# Patient Record
Sex: Female | Born: 1975
Health system: Southern US, Community
[De-identification: ages and names within clinical notes are randomized; demographics above are authoritative.]

## PROBLEM LIST (undated history)

## (undated) DIAGNOSIS — R87619 Unspecified abnormal cytological findings in specimens from cervix uteri: Secondary | ICD-10-CM

## (undated) DIAGNOSIS — E282 Polycystic ovarian syndrome: Secondary | ICD-10-CM

## (undated) DIAGNOSIS — R7303 Prediabetes: Secondary | ICD-10-CM

## (undated) DIAGNOSIS — R946 Abnormal results of thyroid function studies: Secondary | ICD-10-CM

## (undated) DIAGNOSIS — B009 Herpesviral infection, unspecified: Secondary | ICD-10-CM

## (undated) DIAGNOSIS — IMO0002 Reserved for concepts with insufficient information to code with codable children: Secondary | ICD-10-CM

## (undated) DIAGNOSIS — M1990 Unspecified osteoarthritis, unspecified site: Secondary | ICD-10-CM

## (undated) DIAGNOSIS — K219 Gastro-esophageal reflux disease without esophagitis: Secondary | ICD-10-CM

## (undated) DIAGNOSIS — T7840XA Allergy, unspecified, initial encounter: Secondary | ICD-10-CM

## (undated) DIAGNOSIS — E119 Type 2 diabetes mellitus without complications: Secondary | ICD-10-CM

## (undated) DIAGNOSIS — L680 Hirsutism: Secondary | ICD-10-CM

## (undated) HISTORY — DX: Unspecified osteoarthritis, unspecified site: M19.90

## (undated) HISTORY — DX: Prediabetes: R73.03

## (undated) HISTORY — DX: Allergy, unspecified, initial encounter: T78.40XA

## (undated) HISTORY — DX: Reserved for concepts with insufficient information to code with codable children: IMO0002

## (undated) HISTORY — DX: Herpesviral infection, unspecified: B00.9

## (undated) HISTORY — DX: Abnormal results of thyroid function studies: R94.6

## (undated) HISTORY — DX: Polycystic ovarian syndrome: E28.2

## (undated) HISTORY — DX: Gastro-esophageal reflux disease without esophagitis: K21.9

## (undated) HISTORY — DX: Hirsutism: L68.0

## (undated) HISTORY — DX: Unspecified abnormal cytological findings in specimens from cervix uteri: R87.619

---

## 2007-07-05 ENCOUNTER — Ambulatory Visit: Payer: Self-pay | Admitting: Physician Assistant

## 2007-07-05 ENCOUNTER — Encounter: Payer: Self-pay | Admitting: Physician Assistant

## 2007-07-05 ENCOUNTER — Other Ambulatory Visit: Admission: RE | Admit: 2007-07-05 | Discharge: 2007-07-05 | Payer: Self-pay | Admitting: Gynecology

## 2010-09-15 ENCOUNTER — Encounter: Payer: Self-pay | Admitting: Obstetrics & Gynecology

## 2010-09-15 LAB — CONVERTED CEMR LAB
17-OH-Progesterone, LC/MS/MS: <8
Glucose, 2 hour: 108 mg/dL (ref 70–139)
Glucose, Fasting: 82 mg/dL (ref 70–99)
Insulin fasting, serum: 25 microintl units/mL (ref 3–28)

## 2010-09-20 ENCOUNTER — Encounter: Payer: Self-pay | Admitting: Obstetrics & Gynecology

## 2010-09-20 LAB — CONVERTED CEMR LAB
ALT: 9 units/L (ref 0–35)
AST: 11 units/L (ref 0–37)
Alkaline Phosphatase: 46 units/L (ref 39–117)
Creatinine, Ser: 0.91 mg/dL (ref 0.40–1.20)
Total Bilirubin: 0.3 mg/dL (ref 0.3–1.2)

## 2010-10-25 NOTE — Assessment & Plan Note (Signed)
NAME:  Cheryl Welch, WIERS NO.:  [id number]  MEDICAL RECORD NO.:  [id number]           PATIENT TYPE:  LOCATION:  [facility] [place]           FACILITY:  PHYSICIAN:  Elsie Lincoln, MD           DATE OF BIRTH:  DATE OF SERVICE:  10/18/2010                                 CLINIC NOTE  The patient is a 35 year old, G2 P2, female on [place] who is complaining of thinning hair on the head but also mostly female pattern hair growth on her face and abdomen.  The patient has never menstruated well without being on birth control pills.  She had problems getting pregnant, but she did get pregnant twice and has two living children, one was by C- section.  Her periods are still heavy on Beyaz, the birth control pills. She is not having any bleeding between periods.  She has had laser hair removal for some of her hair growth.  PAST MEDICAL HISTORY:  Denies all medical problems.  PAST SURGICAL HISTORY:  C-section in 1994, did not receive a blood transfusion.  GYNECOLOGIC HISTORY:  She had abnormal Pap smear that required a colpo in 2003.  Last Pap smear was in 2008.  She has no history of endometriosis, fibroids, ovarian cyst, no history of sexually transmitted diseases and again, she is on Beyaz and has regular periods that last 5 days, but they are heavy.  MEDICATIONS:  None.  ALLERGIES:  None.  SOCIAL HISTORY:  She is employed by [employer] as a Diplomatic Services operational officer.  She does smoke.  She drinks occasional alcohol.  She does not do drugs.  She has never been sexually or physically abused.  She has had one sexual partner in the last year.  FAMILY HISTORY:  Positive for diabetes, high blood pressure in her parents, and kidney cancer on her father's side.  Review is negative today.  LABORATORY TESTS:  Her prolactin is 11 which is negative.  Total testosterone is 20 which is negative.  The patient had a fasting glucose at 82 with a fasting insulin of 25, and one-hour glucose of 141  with one- hour insulin of 313 and two-hour glucose of 108 with a two-hour glucose of 205.  The patient shows evidence of insulin resistance.  DHEA-sulfate 57.  A 17-hydroxy progesterone less than 8.  CMP shows a normal creatinine of 0.91 and all electrolytes are normal.  ASSESSMENT AND PLAN:  This is a 35 year old female with mild hirsutism, probably anovulatory if not on the birth control pills, most likely polycystic ovary. 1. Continue birth control pills to keep the endometrium thin and to     keep her from getting endometrial hyperplasia. 2. We will add metformin for insulin resistance, and hopefully, this     will help decrease some of her hair growth. 3. We will continue spironolactone if hair growth does not improve. 4. Continue laser hair removal cosmetically necessary.          ______________________________ Elsie Lincoln, MD    KL/MEDQ  D:  10/18/2010  T:  10/19/2010  Job:  [id number]

## 2010-12-06 NOTE — Assessment & Plan Note (Signed)
NAME:  Cheryl Welch, Cheryl Welch NO.:  [id number]   MEDICAL RECORD NO.:  [id number]          PATIENT TYPE:  POB   LOCATION:  [facility] [place]         FACILITY:  [facility]   PHYSICIAN:  Maylon Cos, CNM    DATE OF BIRTH:  Aug 12, 1975   DATE OF SERVICE:  07/05/2007                                  CLINIC NOTE   Cheryl Welch presents today as a 35 year old, African-American female, for a  GYN exam.  Cheryl Welch recently moved from [place] and has a Cheryl care  Cheryl Welch, who she saw in October for a physical exam; however, she was  on her menstrual cycle at the time of that exam and her breast exam and  a Pap/pelvic were deferred.  She presents today for breast exam and a  Pap/pelvic only since she has a Cheryl Welch, who has performed her  annual physical exam previous this year.  She has no complaints in this  regard, and her physical exam is pointed.   PHYSICAL EXAMINATION:  GENERAL:  She is a well-dressed, African-American  female, who appears to be her stated age, in no apparent distress, and  is alert and oriented x3.  HEENT EXAM:  Grossly normal, and cranial nerves are grossly intact.  She  has good dentition.  BREAST EXAM:  Her breasts are symmetrical.  She has no masses.  They are  nontender.  No skin discoloration or changes.  Her nipples are erect  without discharge.  No lymphadenopathy.  PELVIC EXAM:  External genitalia:  She is a Tanner 5 with no lesions on  the external genitalia.  Mucous membranes are pink, and she has good  tone and a regular rugae.  Cervical exam:  Her cervix is completely  anterior on speculum exam but smooth without lesion.  It is non-friable  and she has no cervical motion tenderness on bimanual exam.  Her uterus  is midline, nontender, and not enlarged.  Adnexa also nontender and not  enlarged.   PROBLEM LIST:  She is a 35 year old, African-American female with a  normal female exam.  Contraceptive-wise, she desires no future fertility  and  desires refills on her Cheryl Welch prescription, which were given x1  year.  The patient is encouraged to do monthly self-breast exams and  return in one year for a Pap smear with [place] or with her Cheryl care  Cheryl Welch.           ______________________________  Maylon Cos, CNM     SS/MEDQ  D:  07/05/2007  T:  07/05/2007  Job:  [id number]

## 2011-05-04 ENCOUNTER — Encounter: Payer: Self-pay | Admitting: *Deleted

## 2011-05-05 ENCOUNTER — Ambulatory Visit (INDEPENDENT_AMBULATORY_CARE_PROVIDER_SITE_OTHER): Payer: Managed Care, Other (non HMO) | Admitting: Family

## 2011-05-05 ENCOUNTER — Encounter: Payer: Self-pay | Admitting: Family

## 2011-05-05 DIAGNOSIS — Z1272 Encounter for screening for malignant neoplasm of vagina: Secondary | ICD-10-CM

## 2011-05-05 DIAGNOSIS — E282 Polycystic ovarian syndrome: Secondary | ICD-10-CM | POA: Insufficient documentation

## 2011-05-05 DIAGNOSIS — N912 Amenorrhea, unspecified: Secondary | ICD-10-CM

## 2011-05-05 DIAGNOSIS — Z01419 Encounter for gynecological examination (general) (routine) without abnormal findings: Secondary | ICD-10-CM

## 2011-05-05 DIAGNOSIS — Z113 Encounter for screening for infections with a predominantly sexual mode of transmission: Secondary | ICD-10-CM

## 2011-05-05 LAB — WET PREP FOR TRICH, YEAST, CLUE

## 2011-05-05 NOTE — Patient Instructions (Signed)
Primary Amenorrhea   No Periods, Absent Periods, Absent Menses   Primary amenorrhea is the absence of any menstrual flow in a woman by the age of 12. An average age for the start of menstruation is age 35. Primary amenorrhea is not considered to have occurred until a girl is over age 29 and has never menstruated. This may occur with or without other signs of puberty. CAUSES Some common causes of not menstruating include:  Malnutrition.   Low blood sugar (hypoglycemia).     Polycystic ovarian disease (cysts in the ovaries, not ovulating).     Absence of the vagina, uterus, or ovaries since birth (congenital).     Extreme obesity.   Cystic fibrosis.     Drastic weight loss from any cause.     Over-exercising (running, biking) causing loss of body fat.     Pituitary gland tumor in the brain.     Long-term (chronic) illnesses.   Cushing's disease.     Thyroid disease (hypothyroidism, hyperthyroidism).     Part of the brain (hypothalamus) not functioning.     Premature ovarian failure.     DIAGNOSIS This diagnosis is made by doing a medical history and physical exam. Often, numerous blood tests of different hormones in the body may be done, along with some urine testing. Specialized x-rays may need to be done, as well as measuring the Body Mass Index (BMI). If your BMI is less than 20, the hypothalamus may be the problem. If it is greater than 30, the cause may be diabetes mellitus. Pregnancy must be ruled out. TREATMENT Treatment depends on the cause of the amenorrhea. If an eating disorder is present, this can be treated with an adequate diet and therapy. Chronic illnesses may improve with treatment of the illness. Overall, the outlook is good, and amenorrhea may be corrected with medicines, lifestyle changes, or surgery. If the amenorrhea can not be corrected, it is sometimes possible to create a false menstruation with medicines, to help young women feel more normal. Should  emotional problems occur, your caregiver will be able to help you.   Document Released: 07/10/2005 Document Re-Released: 08/01/2009 [facility] Patient Information 2011 [place], [place].

## 2011-05-05 NOTE — Progress Notes (Signed)
  Subjective:     Cheryl Welch is a 35 y.o. female and is here for a comprehensive physical exam. The patient reports on oral contraceptive with LMP in 2009 due to not taking last week of ocs to avoid heavy periods, cramping, and clots.  Last pap in 2008.  History of abnormal pap - LSIL with normal colpo.  Pap in 2008 negative.   Reports irritation (raw) area in vagina x one day.  Also reports increased bloating x past year.  Denies pelvic pain, change of appetite or bowel pattern.  History   Social History  . Marital Status: Married    Spouse Name: N/A    Number of Children: N/A  . Years of Education: N/A   Occupational History  . secretary [employer]   Social History Main Topics  . Smoking status: Never Smoker   . Smokeless tobacco: Never Used  . Alcohol Use: Yes     occassional  . Drug Use: No  . Sexually Active: Yes -- Female partner(s)    Birth Control/ Protection: Pill   Other Topics Concern  . Not on file   Social History Narrative  . No narrative on file   Health Maintenance  Topic Date Due  . Pap Smear  04/05/1994  . Tetanus/tdap  04/06/1995  . Influenza Vaccine  04/24/2011    Review of Systems Pertinent items are noted in HPI.   Objective:    BP 118/72  Pulse 80  Temp(Src) 98.6 F (37 C) (Oral)  Resp 97  Ht 5\' 7"  (1.702 m)  Wt 173 lb (78.472 kg)  BMI 27.10 kg/m2 General appearance: alert, cooperative and appears stated age Head: Normocephalic, without obvious abnormality, atraumatic Throat: normal findings: buccal mucosa normal Neck: no adenopathy, no JVD, supple, symmetrical, trachea midline and thyroid not enlarged, symmetric, no tenderness/mass/nodules Lungs: clear to auscultation bilaterally Breasts: normal appearance, no masses or tenderness, No nipple retraction or dimpling, No nipple discharge or bleeding, No axillary or supraclavicular adenopathy, Normal to palpation without dominant masses, Taught monthly breast self examination Heart: regular  rate and rhythm, S1, S2 normal, no murmur, click, rub or gallop Abdomen: soft, non-tender; bowel sounds normal; no masses,  no organomegaly Pelvic: cervix normal in appearance, external genitalia normal, no adnexal masses or tenderness, no cervical motion tenderness, rectovaginal septum normal, uterus normal size, shape, and consistency and vagina normal without discharge Skin: Skin color, texture, turgor normal. No rashes or lesions    Assessment:    Healthy female exam.  Amenorrhea     Plan:    Pelvic US Discussed importance of having break thru bleed every four months.  GC/CT, wet prep, and pap to lab  FU in 2 weeks [facility]

## 2011-05-08 ENCOUNTER — Other Ambulatory Visit: Payer: Self-pay | Admitting: Family

## 2011-05-08 DIAGNOSIS — N912 Amenorrhea, unspecified: Secondary | ICD-10-CM

## 2011-05-09 ENCOUNTER — Ambulatory Visit
Admission: RE | Admit: 2011-05-09 | Discharge: 2011-05-09 | Disposition: A | Discharge status: Home / Self Care | Payer: Managed Care, Other (non HMO) | Source: Ambulatory Visit | Attending: Obstetrics and Gynecology | Admitting: Obstetrics and Gynecology

## 2011-05-09 DIAGNOSIS — N912 Amenorrhea, unspecified: Secondary | ICD-10-CM

## 2011-08-01 ENCOUNTER — Encounter: Payer: Self-pay | Admitting: Obstetrics & Gynecology

## 2011-08-01 ENCOUNTER — Other Ambulatory Visit: Payer: Self-pay | Admitting: Obstetrics & Gynecology

## 2011-08-01 MED ORDER — AMOXICILLIN-POT CLAVULANATE 875-125 MG PO TABS: 1.0000 | ORAL_TABLET | Freq: Two times a day (BID) | ORAL | Status: AC

## 2011-08-09 ENCOUNTER — Other Ambulatory Visit: Payer: Self-pay | Admitting: Obstetrics & Gynecology

## 2011-08-09 DIAGNOSIS — N83209 Unspecified ovarian cyst, unspecified side: Secondary | ICD-10-CM

## 2011-08-10 ENCOUNTER — Ambulatory Visit
Admission: RE | Admit: 2011-08-10 | Discharge: 2011-08-10 | Disposition: A | Discharge status: Home / Self Care | Payer: Managed Care, Other (non HMO) | Source: Ambulatory Visit | Attending: Obstetrics & Gynecology | Admitting: Obstetrics & Gynecology

## 2011-08-10 DIAGNOSIS — N83209 Unspecified ovarian cyst, unspecified side: Secondary | ICD-10-CM

## 2011-08-29 ENCOUNTER — Other Ambulatory Visit: Payer: Self-pay | Admitting: Obstetrics & Gynecology

## 2011-09-01 LAB — URINE CULTURE

## 2012-04-08 ENCOUNTER — Emergency Department
Admission: EM | Admit: 2012-04-08 | Discharge: 2012-04-08 | Disposition: A | Discharge status: Home / Self Care | Payer: Managed Care, Other (non HMO) | Source: Home / Self Care

## 2012-04-08 ENCOUNTER — Encounter: Payer: Self-pay | Admitting: *Deleted

## 2012-04-08 DIAGNOSIS — J329 Chronic sinusitis, unspecified: Secondary | ICD-10-CM

## 2012-04-08 MED ORDER — AMOXICILLIN-POT CLAVULANATE 875-125 MG PO TABS: 1.0000 | ORAL_TABLET | Freq: Two times a day (BID) | ORAL | Status: DC

## 2012-04-08 MED ORDER — METHYLPREDNISOLONE SODIUM SUCC 125 MG IJ SOLR
125.0000 mg | Freq: Once | INTRAMUSCULAR | Status: AC
  Administered 2012-04-08: 125 mg via INTRAMUSCULAR

## 2012-04-08 NOTE — ED Notes (Signed)
Patient c/o sinus pain, mostly on left side, HA, and eye pain x 5 days. Taken sudafed, Mucinex, IBF, and allegra without relief.

## 2012-04-08 NOTE — ED Provider Notes (Signed)
History     CSN: [id number]  Arrival date & time 04/08/12  1418   First MD Initiated Contact with Patient 04/08/12 1438      Chief Complaint  Patient presents with  . Facial Pain   HPI SINUSITIS Onset:  5-7 days   Location: L frontal/maxillary sinus Description:sinus pressure, nasal congestion, mild cough, severe L sided sinus pain. No LOV.   Modifying factors: none  Symptoms Cough:  mild Discharge:  Post nasal drip Fever: np Sinus Pressure:  yes Ears Blocked:  np Teeth Ache:  mild Frontal Headache:  yes Second Sickening:  no  Red Flags Change in mental state: no Change in vision: no    Past Medical History  Diagnosis Date  . PCO (polycystic ovaries)   . Hirsutism   . Abnormal Pap smear     2004; LGSIL??>colpo>nml    Past Surgical History  Procedure Date  . Cesarean section 1994    Family History  Problem Relation Age of Onset  . Cancer Father     kidney  . Hypertension Father   . Hypertension Mother   . Diabetes Maternal Grandmother     History  Substance Use Topics  . Smoking status: Never Smoker   . Smokeless tobacco: Never Used  . Alcohol Use: Yes     occassional    OB History    Grav Para Term Preterm Abortions TAB SAB Ect Mult Living   2 2 2       2       Review of Systems  All other systems reviewed and are negative.    Allergies  Review of patient's allergies indicates no known allergies.  Home Medications   Current Outpatient Rx  Name Route Sig Dispense Refill  . DROSPIREN-ETH ESTRAD-LEVOMEFOL 3-0.02-0.451 MG PO TABS Oral Take 1 tablet by mouth daily.      . LUBIPROSTONE 8 MCG PO CAPS Oral Take 8 mcg by mouth 2 (two) times daily with a meal.      . METFORMIN HCL 500 MG PO TABS Oral Take 500 mg by mouth 2 (two) times daily with a meal.      . SPIRONOLACTONE 50 MG PO TABS Oral Take 50 mg by mouth daily.        BP 138/88  Pulse 99  Temp 99.5 F (37.5 C) (Oral)  Resp 14  Ht 5\' 7"  (1.702 m)  Wt 179 lb (81.194 kg)  BMI  28.04 kg/m2  SpO2 100%  Physical Exam  Constitutional: She appears well-developed and well-nourished.  HENT:  Head: Normocephalic and atraumatic.  Right Ear: External ear normal.  Left Ear: External ear normal.       +mild nasal erythema, + post oropharyngeal erythema  + TTP over L maxillary/frontal sinus    Eyes: Conjunctivae normal are normal. Pupils are equal, round, and reactive to light.  Neck: Normal range of motion. Neck supple.  Cardiovascular: Normal rate and regular rhythm.   Pulmonary/Chest: Effort normal and breath sounds normal.  Abdominal: Soft. Bowel sounds are normal.  Lymphadenopathy:    She has no cervical adenopathy.    ED Course  Procedures (including critical care time)  Labs Reviewed - No data to display No results found.   1. Sinusitis       MDM  Will treat with augmentin x 10 days.  Solumedrol 125mg  IM x 1.  Restart allegra and flonase.  Discussed infectious red flags.  Follow up as needed.      The patient and/or  caregiver has been counseled thoroughly with regard to treatment plan and/or medications prescribed including dosage, schedule, interactions, rationale for use, and possible side effects and they verbalize understanding. Diagnoses and expected course of recovery discussed and will return if not improved as expected or if the condition worsens. Patient and/or caregiver verbalized understanding.             Doree Albee, MD 04/08/12 [phone]

## 2012-08-01 ENCOUNTER — Ambulatory Visit: Payer: Managed Care, Other (non HMO) | Admitting: Obstetrics & Gynecology

## 2012-10-31 ENCOUNTER — Other Ambulatory Visit: Payer: Self-pay | Admitting: *Deleted

## 2012-10-31 ENCOUNTER — Other Ambulatory Visit: Payer: Self-pay | Admitting: Obstetrics & Gynecology

## 2012-10-31 MED ORDER — LUBIPROSTONE 8 MCG PO CAPS: 8.0000 ug | ORAL_CAPSULE | Freq: Two times a day (BID) | ORAL | Status: DC

## 2012-10-31 MED ORDER — LACTULOSE 10 GM/15ML PO SOLN: 20.0000 g | Freq: Two times a day (BID) | ORAL | Status: DC

## 2012-10-31 NOTE — Telephone Encounter (Signed)
error 

## 2013-06-13 ENCOUNTER — Other Ambulatory Visit: Payer: Self-pay | Admitting: Advanced Practice Midwife

## 2013-06-13 DIAGNOSIS — B001 Herpesviral vesicular dermatitis: Secondary | ICD-10-CM

## 2013-06-13 MED ORDER — VALACYCLOVIR HCL 500 MG PO TABS: 2000.0000 mg | ORAL_TABLET | Freq: Two times a day (BID) | ORAL | Status: DC

## 2013-06-13 NOTE — Progress Notes (Signed)
Cold sore

## 2013-06-28 ENCOUNTER — Encounter: Payer: 59 | Attending: Internal Medicine | Admitting: *Deleted

## 2013-06-28 DIAGNOSIS — E669 Obesity, unspecified: Secondary | ICD-10-CM | POA: Insufficient documentation

## 2013-06-28 DIAGNOSIS — Z713 Dietary counseling and surveillance: Secondary | ICD-10-CM | POA: Insufficient documentation

## 2013-06-28 NOTE — Progress Notes (Signed)
Subjective:     Patient ID: Cheryl Welch, female   DOB: [birth year], 37 y.o.   MRN: [id number]  Patient was seen on 06/28/2013 for the Weight Loss Class at the [facility]. The following learning objectives were met by the patient during this class:   Describe healthy choices in each food group  Describe portion size of foods  Use plate method for meal planning  Demonstrate how to read Nutrition Facts food label  Set realistic goals for weight loss, diet changes, and physical activity.      HPI Review of Systems Objective:   Physical Exam Assessment:   Plan:

## 2014-05-25 ENCOUNTER — Encounter: Payer: Self-pay | Admitting: *Deleted

## 2014-08-03 ENCOUNTER — Other Ambulatory Visit (HOSPITAL_BASED_OUTPATIENT_CLINIC_OR_DEPARTMENT_OTHER): Payer: Self-pay | Admitting: Internal Medicine

## 2014-08-03 DIAGNOSIS — M7989 Other specified soft tissue disorders: Secondary | ICD-10-CM

## 2015-07-20 DIAGNOSIS — E038 Other specified hypothyroidism: Secondary | ICD-10-CM | POA: Insufficient documentation

## 2015-07-20 DIAGNOSIS — E039 Hypothyroidism, unspecified: Secondary | ICD-10-CM

## 2015-07-20 DIAGNOSIS — K30 Functional dyspepsia: Secondary | ICD-10-CM | POA: Insufficient documentation

## 2015-07-20 DIAGNOSIS — J309 Allergic rhinitis, unspecified: Secondary | ICD-10-CM | POA: Insufficient documentation

## 2015-07-20 DIAGNOSIS — L659 Nonscarring hair loss, unspecified: Secondary | ICD-10-CM | POA: Insufficient documentation

## 2015-07-20 HISTORY — DX: Hypothyroidism, unspecified: E03.9

## 2015-08-27 DIAGNOSIS — Z0000 Encounter for general adult medical examination without abnormal findings: Secondary | ICD-10-CM | POA: Diagnosis not present

## 2015-08-27 LAB — CBC AND DIFFERENTIAL
HCT: 42 (ref 36–46)
HEMOGLOBIN: 13.2 (ref 12.0–16.0)
Platelets: 274 (ref 150–399)
WBC: 7.1

## 2015-08-27 LAB — HEPATIC FUNCTION PANEL
ALK PHOS: 94 (ref 25–125)
ALT: 8 (ref 7–35)
AST: 10 — AB (ref 13–35)

## 2015-08-27 LAB — BASIC METABOLIC PANEL
BUN: 5 (ref 4–21)
Creatinine: 0.7 (ref <=1.1)
GLUCOSE: 72
POTASSIUM: 4.4 (ref 3.4–5.3)
SODIUM: 137 (ref 137–147)

## 2015-08-27 LAB — LIPID PANEL
Cholesterol: 191 (ref 0–200)
HDL: 70 (ref 35–70)
LDL CALC: 107
Triglycerides: 110 (ref 40–160)

## 2015-08-27 LAB — TSH: TSH: 1.78 (ref <=5.90)

## 2015-09-03 DIAGNOSIS — B009 Herpesviral infection, unspecified: Secondary | ICD-10-CM

## 2015-09-03 DIAGNOSIS — E282 Polycystic ovarian syndrome: Secondary | ICD-10-CM | POA: Diagnosis not present

## 2015-09-03 DIAGNOSIS — R319 Hematuria, unspecified: Secondary | ICD-10-CM | POA: Diagnosis not present

## 2015-09-03 DIAGNOSIS — Z0000 Encounter for general adult medical examination without abnormal findings: Secondary | ICD-10-CM | POA: Diagnosis not present

## 2015-09-03 DIAGNOSIS — K644 Residual hemorrhoidal skin tags: Secondary | ICD-10-CM | POA: Insufficient documentation

## 2015-09-03 DIAGNOSIS — J3089 Other allergic rhinitis: Secondary | ICD-10-CM | POA: Diagnosis not present

## 2015-09-03 DIAGNOSIS — L659 Nonscarring hair loss, unspecified: Secondary | ICD-10-CM | POA: Diagnosis not present

## 2015-09-03 DIAGNOSIS — B001 Herpesviral vesicular dermatitis: Secondary | ICD-10-CM | POA: Insufficient documentation

## 2015-09-03 HISTORY — DX: Hematuria, unspecified: R31.9

## 2015-09-03 HISTORY — DX: Herpesviral infection, unspecified: B00.9

## 2015-09-03 MED FILL — FLUTICASONE PROP 50 MCG SPR: 50 | 30 days supply | Qty: 16 | Fill #0

## 2015-09-03 MED FILL — spIRONOLACTONE 100 MG TAB: 100 | 30 days supply | Qty: 60 | Fill #0

## 2015-09-03 MED FILL — SAFYRAL TABLET: 3-0.03-0.45 | 28 days supply | Qty: 28 | Fill #5

## 2015-09-03 MED FILL — VALACYCLOVIR HCL 500 MG TAB: 500 | 30 days supply | Qty: 30 | Fill #0

## 2015-10-14 MED FILL — FLUTICASONE PROP 50 MCG SPR: 50 | 30 days supply | Qty: 16 | Fill #1

## 2015-10-14 MED FILL — SAFYRAL TABLET: 3-0.03-0.45 | 28 days supply | Qty: 28 | Fill #6

## 2015-10-14 MED FILL — SPIRONOLACTONE 100 MG TAB: 100 | 30 days supply | Qty: 60 | Fill #1

## 2015-10-14 MED FILL — VALACYCLOVIR HCL 500 MG TAB: 500 | 30 days supply | Qty: 30 | Fill #1

## 2015-11-15 DIAGNOSIS — R944 Abnormal results of kidney function studies: Secondary | ICD-10-CM | POA: Diagnosis not present

## 2015-12-29 MED FILL — SPIRONOLACTONE 100 MG TAB: 100 | 30 days supply | Qty: 60 | Fill #2

## 2016-01-03 MED FILL — SAFYRAL TABLET: 3-0.03-0.45 | 28 days supply | Qty: 28 | Fill #0

## 2016-03-16 MED FILL — SAFYRAL TABLET: 3-0.03-0.45 | 28 days supply | Qty: 28 | Fill #1

## 2016-03-23 ENCOUNTER — Other Ambulatory Visit: Payer: Self-pay | Admitting: Sports Medicine

## 2016-03-23 DIAGNOSIS — M25561 Pain in right knee: Secondary | ICD-10-CM

## 2016-03-23 DIAGNOSIS — M25562 Pain in left knee: Principal | ICD-10-CM

## 2016-03-24 ENCOUNTER — Ambulatory Visit (INDEPENDENT_AMBULATORY_CARE_PROVIDER_SITE_OTHER): Payer: 59

## 2016-03-24 DIAGNOSIS — M25562 Pain in left knee: Secondary | ICD-10-CM | POA: Diagnosis not present

## 2016-03-24 DIAGNOSIS — M25561 Pain in right knee: Secondary | ICD-10-CM

## 2016-04-21 MED FILL — SAFYRAL TABLET: 3-0.03-0.45 | 28 days supply | Qty: 28 | Fill #2

## 2016-06-02 MED FILL — SAFYRAL TABLET: 3-0.03-0.45 | 28 days supply | Qty: 28 | Fill #3

## 2016-06-02 MED FILL — VALACYCLOVIR HCL 500 MG TAB: 500 | 30 days supply | Qty: 30 | Fill #2

## 2016-07-06 MED FILL — SAFYRAL TABLET: 3-0.03-0.45 | 28 days supply | Qty: 28 | Fill #4

## 2016-07-28 DIAGNOSIS — M2242 Chondromalacia patellae, left knee: Secondary | ICD-10-CM | POA: Diagnosis not present

## 2016-07-28 MED FILL — SAFYRAL TABLET: 3-0.03-0.45 | 28 days supply | Qty: 28 | Fill #5

## 2016-08-04 MED FILL — FLUTICASONE PROP 50 MCG SPR: 50 | 30 days supply | Qty: 16 | Fill #2

## 2016-08-04 MED FILL — VALACYCLOVIR HCL 500 MG TAB: 500 | 90 days supply | Qty: 90 | Fill #3

## 2016-09-06 MED FILL — SAFYRAL TABLET: 3-0.03-0.45 | 28 days supply | Qty: 28 | Fill #6

## 2016-10-03 MED FILL — SAFYRAL TABLET: 3-0.03-0.45 | 28 days supply | Qty: 28 | Fill #7

## 2016-10-25 MED FILL — SAFYRAL TABLET: 3-0.03-0.45 | 28 days supply | Qty: 28 | Fill #8

## 2016-11-08 DIAGNOSIS — E282 Polycystic ovarian syndrome: Secondary | ICD-10-CM | POA: Diagnosis not present

## 2016-11-08 DIAGNOSIS — N926 Irregular menstruation, unspecified: Secondary | ICD-10-CM | POA: Diagnosis not present

## 2016-11-08 DIAGNOSIS — N939 Abnormal uterine and vaginal bleeding, unspecified: Secondary | ICD-10-CM | POA: Diagnosis not present

## 2016-11-08 DIAGNOSIS — R7303 Prediabetes: Secondary | ICD-10-CM | POA: Diagnosis not present

## 2016-11-08 DIAGNOSIS — E039 Hypothyroidism, unspecified: Secondary | ICD-10-CM | POA: Diagnosis not present

## 2016-11-17 MED FILL — INTROVALE 0.15-0.03 MG TAB: 0.15-0.03 | 91 days supply | Qty: 91 | Fill #0

## 2016-12-22 DIAGNOSIS — Z01419 Encounter for gynecological examination (general) (routine) without abnormal findings: Secondary | ICD-10-CM | POA: Diagnosis not present

## 2016-12-22 DIAGNOSIS — Z6841 Body Mass Index (BMI) 40.0 and over, adult: Secondary | ICD-10-CM | POA: Diagnosis not present

## 2016-12-22 DIAGNOSIS — Z1151 Encounter for screening for human papillomavirus (HPV): Secondary | ICD-10-CM | POA: Diagnosis not present

## 2016-12-25 MED FILL — METFORMIN HCL ER 500 MG TAB: 500 | 30 days supply | Qty: 30 | Fill #0

## 2017-01-09 DIAGNOSIS — D259 Leiomyoma of uterus, unspecified: Secondary | ICD-10-CM | POA: Diagnosis not present

## 2017-01-09 DIAGNOSIS — E282 Polycystic ovarian syndrome: Secondary | ICD-10-CM | POA: Diagnosis not present

## 2017-02-06 ENCOUNTER — Encounter: Payer: Self-pay | Admitting: Gastroenterology

## 2017-02-07 MED FILL — SETLAKIN 0.15 MG-0.03 MG TA: 0.15-0.03 | 91 days supply | Qty: 91 | Fill #1

## 2017-02-15 ENCOUNTER — Ambulatory Visit (INDEPENDENT_AMBULATORY_CARE_PROVIDER_SITE_OTHER): Payer: 59 | Admitting: Gastroenterology

## 2017-02-15 ENCOUNTER — Encounter (INDEPENDENT_AMBULATORY_CARE_PROVIDER_SITE_OTHER): Payer: Self-pay

## 2017-02-15 ENCOUNTER — Encounter: Payer: Self-pay | Admitting: Gastroenterology

## 2017-02-15 VITALS — BP 126/74 | HR 94 | Ht 68.0 in | Wt 200.0 lb

## 2017-02-15 DIAGNOSIS — K219 Gastro-esophageal reflux disease without esophagitis: Secondary | ICD-10-CM | POA: Diagnosis not present

## 2017-02-15 DIAGNOSIS — K5900 Constipation, unspecified: Secondary | ICD-10-CM | POA: Diagnosis not present

## 2017-02-15 MED ORDER — PANTOPRAZOLE SODIUM 40 MG PO TBEC: 40.0000 mg | DELAYED_RELEASE_TABLET | Freq: Every day | ORAL | 2 refills | Status: DC

## 2017-02-15 MED FILL — PANTOPRAZOLE SOD DR 40 MG T: 40 | 30 days supply | Qty: 30 | Fill #0

## 2017-02-15 NOTE — Progress Notes (Signed)
Cheryl Welch 41 y.o. 13-May-1976 [id number]  Assessment & Plan:   Encounter Diagnoses  Name Primary?  . Gastroesophageal reflux disease, esophagitis presence not specified Yes  . Constipation, unspecified constipation type    1. Try Rx pantoprazole 40mg  daily to control acid reflux symptoms.   2. Encourage daily usage of Miralax or Benefiber to control constipation and straining.  3. GERD diet given to patient.  Patient education provided about eating smaller meals, limit carbonated beverages, do not lay down directly after eating, and to elevate head of bed to control symptoms.  3. Follow up Aug 30th with Alonza Bogus PA-C   Subjective:   Chief Complaint: 1. "I cough and burp a lot when I eat, or after eating" 2. Constipation  HPI Cheryl Welch is a pleasant 41yo african [place] female who presents the office today for evaluation of chronic reflux like symptoms as well as chronic intermittent constipation.  She has a past medical history significant for poly cystic ovarian syndrome and insulin resistance- non diabetic.    Today she complains of a long history (years) of having increased coughing and needing to clear her throat while eating or shortly after eating.  She describes excess gassiness, belching and mild bloating.  She denies dysphagia or sensation of food sticking.  She sometimes sips on coke while eating to help her burp and relieve some of the pressure and wash the food down.  She also says she tastes her food when she burps and at times has a mild burning sensation in her throat.  These symptoms increase if she lays down and she says at night she feels the burning in her throat. She has tried Nexium but not on a consistent basis and said when she did take it she is not sure it helped.  She denies nausea or vomiting or weight loss.  Her constipation is also chronic in nature and she has dealt with it for years.  She denies abdominal pain, but says she feels  bloated most of the time.  She has 2 bowel movements per week but often strains and has to take a long time to have a bowel movement.  She has tried Miralax and some OTC stool softner but has never been consistent with use.  She has a history of hemorrhoids, but they are asymptomatic at this time.  She does not have any episodes of diarrhea.  No Known Allergies Current Meds  Medication Sig  . metFORMIN (GLUCOPHAGE) 500 MG tablet Take 500 mg by mouth 2 (two) times daily with a meal.    . spironolactone (ALDACTONE) 50 MG tablet Take 100 mg by mouth 2 (two) times daily.   . valACYclovir (VALTREX) 500 MG tablet Take 4 tablets (2,000 mg total) by mouth 2 (two) times daily. 4 tabs now and 4 tabs 12 hrs later. Take 1 tablet daily PRN for suppression.   Past Medical History:  Diagnosis Date  . Abnormal Pap smear    2004; LGSIL??>colpo>nml  . Hirsutism   . PCO (polycystic ovaries)    Past Surgical History:  Procedure Laterality Date  . [place]   Social History   Social History  . Marital status: Married    Spouse name: N/A  . Number of children: N/A  . 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 Smoking status: Never Smoker  . Smokeless tobacco: Never Used  . Alcohol use  Yes     Comment: occassional  . Drug use: No  . Sexual activity: Yes    Partners: Male    Birth control/ protection: Pill   Other Topics Concern  . Not on file   Social History Narrative  . No narrative on file   family history includes Cancer in her father; Colon polyps in her father; Diabetes in her maternal grandmother; Hypertension in her father and mother.   Review of Systems Review of Systems  Constitutional: Negative for chills, fever and weight loss.  HENT: Negative.   Respiratory: Positive for cough. Negative for hemoptysis, sputum production, shortness of breath and wheezing.   Cardiovascular: Negative.  Negative for  chest pain, palpitations and leg swelling.  Gastrointestinal: Positive for abdominal pain, constipation and heartburn. Negative for blood in stool, diarrhea, nausea and vomiting.  Genitourinary: Negative.   Skin: Negative for itching and rash.  Psychiatric/Behavioral: Negative.      Objective:   Physical Exam @BP  126/74 (BP Location: Left Arm, Patient Position: Sitting, Cuff Size: Normal)   Pulse 94   Ht 5\' 8"  (1.727 m)   Wt 200 lb (90.7 kg)   BMI 30.41 kg/m @  General:  Well-developed, well-nourished and in no   acute distress Eyes:  anicteric. Neck:   supple w/o thyromegaly or mass.  Lungs: Clear to auscultation bilaterally. Heart:   S1S2, no rubs, murmurs, gallops. Abdomen:  soft, mild epigastric tenderness, no    hepatosplenomegaly,  hernia, or mass and   BS+.  Rectal: Not performed Lymph:  no cervical or supraclavicular adenopathy. Extremities:   no edema, cyanosis or clubbing Skin   no rash. Neuro:  A&O x 3.  Psych:  appropriate mood and  Affect.   Edward Qualia, [place]

## 2017-02-15 NOTE — Patient Instructions (Signed)
We have sent the following medications to your pharmacy for you to pick up at your convenience:  Pantoprazole 40 mg daily.   Consider starting either daily fiber supplement such as Benefiber, citrucel, or Miralax.

## 2017-02-16 NOTE — Progress Notes (Signed)
Initial assessment and plans reviewed 

## 2017-03-09 DIAGNOSIS — N926 Irregular menstruation, unspecified: Secondary | ICD-10-CM | POA: Diagnosis not present

## 2017-03-09 DIAGNOSIS — N939 Abnormal uterine and vaginal bleeding, unspecified: Secondary | ICD-10-CM | POA: Diagnosis not present

## 2017-03-09 DIAGNOSIS — R7303 Prediabetes: Secondary | ICD-10-CM | POA: Diagnosis not present

## 2017-03-09 DIAGNOSIS — E039 Hypothyroidism, unspecified: Secondary | ICD-10-CM | POA: Diagnosis not present

## 2017-03-22 ENCOUNTER — Ambulatory Visit: Payer: 59 | Admitting: Gastroenterology

## 2017-03-28 MED FILL — METFORMIN HCL ER 500 MG TAB: 500 | 30 days supply | Qty: 30 | Fill #1

## 2017-03-28 MED FILL — PANTOPRAZOLE SOD DR 40 MG T: 40 | 60 days supply | Qty: 60 | Fill #1

## 2017-05-04 MED FILL — SETLAKIN 0.15 MG-0.03 MG TA: 0.15-0.03 | 91 days supply | Qty: 91 | Fill #2

## 2017-05-08 ENCOUNTER — Emergency Department (INDEPENDENT_AMBULATORY_CARE_PROVIDER_SITE_OTHER)
Admission: EM | Admit: 2017-05-08 | Discharge: 2017-05-08 | Disposition: A | Discharge status: Home / Self Care | Payer: 59 | Source: Home / Self Care | Attending: Family Medicine | Admitting: Family Medicine

## 2017-05-08 ENCOUNTER — Encounter: Payer: Self-pay | Admitting: *Deleted

## 2017-05-08 DIAGNOSIS — R21 Rash and other nonspecific skin eruption: Secondary | ICD-10-CM | POA: Diagnosis not present

## 2017-05-08 MED ORDER — TRIAMCINOLONE ACETONIDE 0.1 % EX CREA: 1.0000 "application " | TOPICAL_CREAM | Freq: Two times a day (BID) | CUTANEOUS | 0 refills | Status: DC

## 2017-05-08 MED ORDER — PERMETHRIN 5 % EX CREA: TOPICAL_CREAM | CUTANEOUS | 0 refills | Status: DC

## 2017-05-08 MED ORDER — PREDNISONE 20 MG PO TABS: ORAL_TABLET | ORAL | 0 refills | Status: DC

## 2017-05-08 MED ORDER — HYDROXYZINE HCL 25 MG PO TABS: 25.0000 mg | ORAL_TABLET | Freq: Four times a day (QID) | ORAL | 0 refills | Status: DC | PRN

## 2017-05-08 MED FILL — PERMETHRIN 5% CREAM: 5 | 7 days supply | Qty: 60 | Fill #0

## 2017-05-08 MED FILL — TRIAMCINOLONE 0.1% CREAM: 0.1 | 15 days supply | Qty: 30 | Fill #0

## 2017-05-08 MED FILL — predniSONE 20 MG TABS: 20 | 5 days supply | Qty: 11 | Fill #0

## 2017-05-08 MED FILL — hydrOXYzine HCL 25 MG TABS: 25 | 3 days supply | Qty: 12 | Fill #0

## 2017-05-08 NOTE — ED Triage Notes (Signed)
Pt c/o itching rash on her chest x 4 days. She has taken Benadryl and Claritin tabs, and applied Hydrocortisone Cream without relief. She does not recall any new creams, soaps or lotions.

## 2017-05-08 NOTE — Discharge Instructions (Signed)
°  Atarax (hydroxizine) is an antihistamine that can be taken to help with itching. This medication can cause drowsiness so do not drive or drink alcohol while taking.    Permethrin- apply from head to toe, avoid face.  Leave on for 8-14 hours, then rinse thoroughly.

## 2017-05-08 NOTE — ED Provider Notes (Signed)
[facility]    CSN: [id number] Arrival date & time: 05/08/17  0805     History   Chief Complaint Chief Complaint  Patient presents with  . Rash    HPI Cheryl Welch is a 41 y.o. female.   HPI  Cheryl Welch is a 42 y.o. female presenting to UC with c/o itching rash on her chest and waistline for about 4 days.  She has taken Benadryl, Claritin, and applied hydrocortisone cream w/o relief.  She notes the itching is worse at night around her breasts and waistline but she has only seen a handful of small red bumps.  She is concerned it may be scabies due to a new bed comforter.  She states her husband has not had any rash or itching. Pt denies itching to arms, hands, legs, and toes.  Denies new soaps, lotions, or medications.  Denies fever or other symptoms.  She did receive her flu vaccine recently but has had it for the last several years w/o reaction.    Past Medical History:  Diagnosis Date  . Abnormal Pap smear    2004; LGSIL??>colpo>nml  . Hirsutism   . PCO (polycystic ovaries)     Patient Active Problem List   Diagnosis Date Noted  . Gastroesophageal reflux disease 02/15/2017  . Constipation 02/15/2017  . PCOS (polycystic ovarian syndrome) 05/05/2011    Past Surgical History:  Procedure Laterality Date  . CESAREAN SECTION  1994    OB History    Gravida Para Term Preterm AB Living   2 2 2     2    SAB TAB Ectopic Multiple Live Births           2       Home Medications    Prior to Admission medications   Medication Sig Start Date End Date Taking? Authorizing Provider  levonorgestrel-ethinyl estradiol (SEASONALE,INTROVALE,JOLESSA) 0.15-0.03 MG tablet Take 1 tablet by mouth daily.   Yes [provider]  metFORMIN (GLUCOPHAGE) 500 MG tablet Take 500 mg by mouth 2 (two) times daily with a meal.     Yes [provider]  spironolactone (ALDACTONE) 50 MG tablet Take 100 mg by mouth 2 (two) times daily.    Yes [provider]  hydrOXYzine (ATARAX/VISTARIL) 25 MG tablet Take 1 tablet (25 mg total) by mouth every 6 (six) hours as needed for itching. 05/08/17   Noe Gens, PA-C  pantoprazole (PROTONIX) 40 MG tablet Take 1 tablet (40 mg total) by mouth daily. 02/15/17   Zehr, Laban Emperor, PA-C  permethrin (ELIMITE) 5 % cream Apply to affected area once 05/08/17   Noe Gens, PA-C  predniSONE (DELTASONE) 20 MG tablet 3 tabs po day one, then 2 po daily x 4 days 05/08/17   Noe Gens, PA-C  triamcinolone cream (KENALOG) 0.1 % Apply 1 application topically 2 (two) times daily. 05/08/17   Noe Gens, PA-C  valACYclovir (VALTREX) 500 MG tablet Take 4 tablets (2,000 mg total) by mouth 2 (two) times daily. 4 tabs now and 4 tabs 12 hrs later. Take 1 tablet daily PRN for suppression. 06/13/13   Manya Silvas, CNM    Family History Family History  Problem Relation Age of Onset  . Cancer Father        kidney  . Hypertension Father   . Colon polyps Father   . Hypertension Mother   . Diabetes Maternal Grandmother     Social History Social History  Substance Use  Topics  . Smoking status: Never Smoker  . Smokeless tobacco: Never Used  . Alcohol use Yes     Comment: occassional     Allergies   Patient has no known allergies.   Review of Systems Review of Systems  Constitutional: Negative for chills and fever.  Gastrointestinal: Negative for abdominal pain, nausea and vomiting.  Musculoskeletal: Negative for arthralgias and myalgias.  Skin: Positive for color change and rash. Negative for pallor and wound.     Physical Exam Triage Vital Signs ED Triage Vitals [05/08/17 0822]  Enc Vitals Group     BP 128/87     Pulse Rate 100     Resp 16     Temp 98.4 F (36.9 C)     Temp Source Oral     SpO2 100 %     Weight 203 lb (92.1 kg)     Height      Head Circumference      Peak Flow      Pain Score      Pain Loc      Pain Edu?      Excl. in [place]?    No data found.   Updated Vital  Signs BP 128/87 (BP Location: Left Arm)   Pulse 100   Temp 98.4 F (36.9 C) (Oral)   Resp 16   Wt 203 lb (92.1 kg)   SpO2 100%   BMI 30.87 kg/m   Visual Acuity Right Eye Distance:   Left Eye Distance:   Bilateral Distance:    Right Eye Near:   Left Eye Near:    Bilateral Near:     Physical Exam  Constitutional: She is oriented to person, place, and time. She appears well-developed and well-nourished. No distress.  HENT:  Head: Normocephalic and atraumatic.  Mouth/Throat: Oropharynx is clear and moist.  Eyes: EOM are normal.  Neck: Normal range of motion.  Cardiovascular: Normal rate.   Pulmonary/Chest: Effort normal. No respiratory distress.  Musculoskeletal: Normal range of motion. She exhibits no edema or tenderness.  Neurological: She is alert and oriented to person, place, and time.  Skin: Skin is warm and dry. Abrasion and rash noted. She is not diaphoretic. There is erythema.     Faint sparse erythematous to scabbed papules. Non-tender.   Psychiatric: She has a normal mood and affect. Her behavior is normal.  Nursing note and vitals reviewed.    UC Treatments / Results  Labs (all labs ordered are listed, but only abnormal results are displayed) Labs Reviewed - No data to display  EKG  EKG Interpretation None       Radiology No results found.  Procedures Procedures (including critical care time)  Medications Ordered in UC Medications - No data to display   Initial Impression / Assessment and Plan / UC Course  I have reviewed the triage vital signs and the nursing notes.  Pertinent labs & imaging results that were available during my care of the patient were reviewed by me and considered in my medical decision making (see chart for details).     Pt concerned for scabies, however, distribution is not c/o typical scabies infection.  Pt's husband, who sleeps in the same bed, denies having symptoms.   Rash/itching could be due to mild allergic  reaction to new comforter or possible heat rash. Will treat symptomatically. Encouraged use of oral prednisone, hydroyxzine and triamcinolone. If still having worsening itching, may try permethrin cream prescribed to hold.  F/u with PCP in  1 week if not improving.   Final Clinical Impressions(s) / UC Diagnoses   Final diagnoses:  Rash and nonspecific skin eruption    New Prescriptions New Prescriptions   HYDROXYZINE (ATARAX/VISTARIL) 25 MG TABLET    Take 1 tablet (25 mg total) by mouth every 6 (six) hours as needed for itching.   PERMETHRIN (ELIMITE) 5 % CREAM    Apply to affected area once   PREDNISONE (DELTASONE) 20 MG TABLET    3 tabs po day one, then 2 po daily x 4 days   TRIAMCINOLONE CREAM (KENALOG) 0.1 %    Apply 1 application topically 2 (two) times daily.     Controlled Substance Prescriptions [place] Controlled Substance Registry consulted? Not Applicable   Tyrell Antonio 05/08/17 9211

## 2017-05-09 ENCOUNTER — Ambulatory Visit (INDEPENDENT_AMBULATORY_CARE_PROVIDER_SITE_OTHER): Payer: 59

## 2017-05-09 ENCOUNTER — Other Ambulatory Visit: Payer: Self-pay | Admitting: Obstetrics and Gynecology

## 2017-05-09 DIAGNOSIS — Z1231 Encounter for screening mammogram for malignant neoplasm of breast: Secondary | ICD-10-CM | POA: Diagnosis not present

## 2017-05-09 DIAGNOSIS — R928 Other abnormal and inconclusive findings on diagnostic imaging of breast: Secondary | ICD-10-CM

## 2017-05-10 ENCOUNTER — Other Ambulatory Visit: Payer: Self-pay | Admitting: Obstetrics and Gynecology

## 2017-05-10 DIAGNOSIS — R928 Other abnormal and inconclusive findings on diagnostic imaging of breast: Secondary | ICD-10-CM

## 2017-05-14 ENCOUNTER — Ambulatory Visit
Admission: RE | Admit: 2017-05-14 | Discharge: 2017-05-14 | Disposition: A | Discharge status: Home / Self Care | Payer: 59 | Source: Ambulatory Visit | Attending: Obstetrics and Gynecology | Admitting: Obstetrics and Gynecology

## 2017-05-14 DIAGNOSIS — R928 Other abnormal and inconclusive findings on diagnostic imaging of breast: Secondary | ICD-10-CM

## 2017-05-14 DIAGNOSIS — N6489 Other specified disorders of breast: Secondary | ICD-10-CM | POA: Diagnosis not present

## 2017-05-18 ENCOUNTER — Other Ambulatory Visit: Payer: 59

## 2017-05-31 ENCOUNTER — Telehealth: Payer: Self-pay | Admitting: *Deleted

## 2017-05-31 ENCOUNTER — Other Ambulatory Visit: Payer: Self-pay | Admitting: Gastroenterology

## 2017-05-31 NOTE — Telephone Encounter (Signed)
The patient did call me back. She is doing well on the Protonix 40 mg once daily.  If she happens to miss a dose she notices problems with choking on foods.  She thinks the Protonix 40 mg works well for her.

## 2017-05-31 NOTE — Telephone Encounter (Signed)
LM for the patient to advise we did sent 2 refills to your pharmacy for the Pantoprazole sodium 40 mg.  Asked her to call me with an update on her reflux she complained about in July 2018.

## 2017-06-12 MED FILL — PANTOPRAZOLE SOD DR 40 MG T: 40 | 90 days supply | Qty: 90 | Fill #0

## 2017-07-11 DIAGNOSIS — M7918 Myalgia, other site: Secondary | ICD-10-CM | POA: Diagnosis not present

## 2017-07-11 DIAGNOSIS — M62830 Muscle spasm of back: Secondary | ICD-10-CM | POA: Diagnosis not present

## 2017-07-11 MED FILL — CYCLOBENZAPRINE 5 MG TABLET: 5 | 10 days supply | Qty: 30 | Fill #0

## 2017-07-11 MED FILL — METFORMIN HCL ER 500 MG TAB: 500 | 30 days supply | Qty: 30 | Fill #2

## 2017-07-23 MED FILL — SETLAKIN 0.15 MG-0.03 MG TA: 0.15-0.03 | 91 days supply | Qty: 91 | Fill #3

## 2017-10-10 ENCOUNTER — Other Ambulatory Visit: Payer: Self-pay | Admitting: Gastroenterology

## 2017-10-10 MED FILL — PANTOPRAZOLE SOD DR 40 MG T: 40 | 30 days supply | Qty: 30 | Fill #0

## 2017-10-17 ENCOUNTER — Encounter: Payer: Self-pay | Admitting: Family Medicine

## 2017-10-17 ENCOUNTER — Ambulatory Visit (INDEPENDENT_AMBULATORY_CARE_PROVIDER_SITE_OTHER): Payer: No Typology Code available for payment source | Admitting: Family Medicine

## 2017-10-17 VITALS — BP 122/90 | HR 92 | Ht 68.0 in | Wt 208.4 lb

## 2017-10-17 DIAGNOSIS — E119 Type 2 diabetes mellitus without complications: Secondary | ICD-10-CM | POA: Insufficient documentation

## 2017-10-17 DIAGNOSIS — R109 Unspecified abdominal pain: Secondary | ICD-10-CM | POA: Diagnosis not present

## 2017-10-17 DIAGNOSIS — R7989 Other specified abnormal findings of blood chemistry: Secondary | ICD-10-CM

## 2017-10-17 DIAGNOSIS — R7303 Prediabetes: Secondary | ICD-10-CM | POA: Diagnosis not present

## 2017-10-17 DIAGNOSIS — E282 Polycystic ovarian syndrome: Secondary | ICD-10-CM

## 2017-10-17 DIAGNOSIS — L659 Nonscarring hair loss, unspecified: Secondary | ICD-10-CM | POA: Diagnosis not present

## 2017-10-17 DIAGNOSIS — K219 Gastro-esophageal reflux disease without esophagitis: Secondary | ICD-10-CM | POA: Diagnosis not present

## 2017-10-17 DIAGNOSIS — E669 Obesity, unspecified: Secondary | ICD-10-CM | POA: Diagnosis not present

## 2017-10-17 DIAGNOSIS — B001 Herpesviral vesicular dermatitis: Secondary | ICD-10-CM

## 2017-10-17 DIAGNOSIS — E663 Overweight: Secondary | ICD-10-CM | POA: Insufficient documentation

## 2017-10-17 HISTORY — DX: Type 2 diabetes mellitus without complications: E11.9

## 2017-10-17 HISTORY — DX: Unspecified abdominal pain: R10.9

## 2017-10-17 MED ORDER — LEVONORGEST-ETH ESTRAD 91-DAY 0.15-0.03 MG PO TABS: 1.0000 | ORAL_TABLET | Freq: Every day | ORAL | 3 refills | Status: DC

## 2017-10-17 MED ORDER — VALACYCLOVIR HCL 500 MG PO TABS: 2000.0000 mg | ORAL_TABLET | Freq: Two times a day (BID) | ORAL | 6 refills | Status: DC

## 2017-10-17 MED FILL — SETLAKIN 0.15 MG-0.03 MG TA: 0.15-0.03 | 91 days supply | Qty: 91 | Fill #0

## 2017-10-17 MED FILL — VALACYCLOVIR HCL 500 MG TAB: 500 | 3 days supply | Qty: 20 | Fill #0

## 2017-10-17 NOTE — Assessment & Plan Note (Signed)
Menstrual cycles are well controlled with OCP, will refill for now however she will continue to see her OB-GYN for routine female care.  Continue metformin for insulin resistance.  Update A1c as well as metabolic panel since with spironolactone use.

## 2017-10-17 NOTE — Assessment & Plan Note (Signed)
History of constipation but denies this currently.  Has had microscopic hematuria in the past as well, check UA.

## 2017-10-17 NOTE — Progress Notes (Signed)
Subjective:    Patient ID: Cheryl Welch, female    DOB: 1976-04-13, 42 y.o.   MRN: [id number]  Chief Complaint  Patient presents with  . New Patient (Initial Visit)  F/u PCOS   HPI Cheryl Welch is a 42 y.o. female here today for initial visit  -PCOS:  History of PCOS with abnormal uterine bleeding and associated insulin resistance and alopecia.  She is also followed by OB-GYN (Dr. Ronita Hipps) who is managing her OCP.  She is treated with metformin for insulin resistance which she is tolerating well.  I do not have a recent A1c on her.  She does report some weight gain, does not have polyuria/polydipsia.  She is taking OCP continuously as she has heavy bleeding if she does a withdrawal period.    -GERD:  Has been followed by [facility] previously.  Symptoms are well controlled with current proton pump inhibitor.  She denies side effects from current medication.  No nausea or significant changes to appetite.  Does feels like she has a "choking sensation" if she stops medication.     -Abnormal Thyroid function tests:  Prior abnormal TFT's, diagnosed with hypothyroidism however no treatment and levels normalized.  Denies any significant symptoms related to thyroid disease other than some mild weight gain and hair loss.    -Left flank pain: She was having some issues with constipation as  however feels that this resolved as well when she began PPI.  She has noticed some occasional cramping/sharp pain in LLQ and L flank area.  No blood in stool, no hx of kidney stone.  She has been seen by nephrology previously for microscopic hematuria.    Review of Systems ROS as noted per HPI, otherwise negative.   Past Medical History:  Diagnosis Date  . Abnormal Pap smear    2004; LGSIL??>colpo>nml  . Abnormal thyroid function test   . GERD (gastroesophageal reflux disease)   . Hirsutism   . HSV infection   . PCO (polycystic ovaries)   . Prediabetes    Past Surgical History:  Procedure  Laterality Date  . CESAREAN SECTION  1994   Family History  Problem Relation Age of Onset  . Cancer Father        kidney  . Hypertension Father   . Colon polyps Father   . Hypertension Mother   . Diabetes Maternal Grandmother    Social History   Socioeconomic History  . Marital status: Married    Spouse name: Not on file  . Number of children: Not on file  . Years of education: Not on file  . Highest education level: Not on file  Occupational History  . 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 Financial resource strain: Not on file  . Food insecurity:    Worry: Not on file    Inability: Not on file  . Transportation needs:    Medical: Not on file    Non-medical: Not on file  Tobacco Use  . Smoking status: Never Smoker  . Smokeless tobacco: Never Used  Substance and Sexual Activity  . Alcohol use: Yes    Comment: occassional  . Drug use: No  . Sexual activity: Yes    Partners: Male    Birth control/protection: Pill  Lifestyle  . Physical activity:    Days per week: Not on file    Minutes per session: Not on file  . Stress: Not on file  Relationships  .  Social connections:    Talks on phone: Not on file    Gets together: Not on file    Attends religious service: Not on file    Active member of club or organization: Not on file    Attends meetings of clubs or organizations: Not on file    Relationship status: Not on file  Other Topics Concern  . Not on file  Social History Narrative  . Not on file   No Known Allergies      Objective:   Physical Exam  Constitutional: She is oriented to person, place, and time. She appears well-nourished. No distress.  HENT:  Head: Normocephalic and atraumatic.  Mouth/Throat: Oropharynx is clear and moist.  Eyes: No scleral icterus.  Neck: Neck supple. No thyromegaly present.  Cardiovascular: Normal rate, regular rhythm and normal heart sounds.  Pulmonary/Chest: Effort normal and breath sounds  normal. She has no wheezes.  Abdominal: Soft. Bowel sounds are normal. She exhibits no distension and no mass. There is tenderness (Mild llq ttp without rebound). There is no rebound and no guarding.  Genitourinary:  Genitourinary Comments: No CVA tenderness  Musculoskeletal: She exhibits no edema.  Lymphadenopathy:    She has no cervical adenopathy.  Neurological: She is alert and oriented to person, place, and time.  Skin: No rash noted.  Psychiatric: She has a normal mood and affect. Her behavior is normal.          Assessment & Plan:  Gastroesophageal reflux disease Doing well with protonix, recommend continuation.   Left flank pain History of constipation but denies this currently.  Has had microscopic hematuria in the past as well, check UA.    PCOS (polycystic ovarian syndrome) Menstrual cycles are well controlled with OCP, will refill for now however she will continue to see her OB-GYN for routine female care.  Continue metformin for insulin resistance.  Update A1c as well as metabolic panel since with spironolactone use.    Prediabetes Update a1c, continue metformin.  Given handout on dietary recommendation to combat insulin resistance associated with her PCOS.  Recommend daily exercise.   Obesity (BMI 30-39.9) Given handout on dietary changes/plan.  Check lipid panel.   Abnormal thyroid blood test No current treatment, updated TSH and Free t4.

## 2017-10-17 NOTE — Assessment & Plan Note (Signed)
Doing well with protonix, recommend continuation.

## 2017-10-17 NOTE — Patient Instructions (Signed)
It was nice to see you today!   Diet for Polycystic Ovarian Syndrome Polycystic ovary syndrome (PCOS) is a disorder of the chemical messengers (hormones) that regulate menstruation. The condition causes important hormones to be out of balance. PCOS can:  Make your periods irregular or stop.  Cause cysts to develop on the ovaries.  Make it difficult to get pregnant.  Stop your body from responding to the effects of insulin (insulin resistance), which can lead to obesity and diabetes.  Changing what you eat can help manage PCOS and improve your health. It can help you lose weight and improve the way your body uses insulin. What is my plan?  Eat breakfast, lunch, and dinner plus two snacks every day.  Include protein in each meal and snack.  Choose whole grains instead of products made with refined flour.  Eat a variety of foods.  Exercise regularly as told by your health care provider. What do I need to know about this eating plan? If you are overweight or obese, pay attention to how many calories you eat. Cutting down on calories can help you lose weight. Work with your health care provider or dietitian to figure out how many calories you need each day. What foods can I eat? Grains Whole grains, such as whole wheat. Whole-grain breads, crackers, cereals, and pasta. Unsweetened oatmeal, bulgur, barley, quinoa, or brown rice. Corn or whole-wheat flour tortillas. Vegetables  Lettuce. Spinach. Peas. Beets. Cauliflower. Cabbage. Broccoli. Carrots. Tomatoes. Squash. Eggplant. Herbs. Peppers. Onions. Cucumbers. Brussels sprouts. Fruits Berries. Bananas. Apples. Oranges. Grapes. Papaya. Mango. Pomegranate. Kiwi. Grapefruit. Cherries. Meats and Other Protein Sources Lean proteins, such as fish, chicken, beans, eggs, and tofu. Dairy Low-fat dairy products, such as skim milk, cheese sticks, and yogurt. Beverages Low-fat or fat-free drinks, such as water, low-fat milk, sugar-free drinks,  and 100% fruit juice. Condiments Ketchup. Mustard. Barbecue sauce. Relish. Low-fat or fat-free mayonnaise. Fats and Oils Olive oil or canola oil. Walnuts and almonds. The items listed above may not be a complete list of recommended foods or beverages. Contact your dietitian for more options. What foods are not recommended? Foods high in calories or fat. Fried foods. Sweets. Products made from refined white flour, including white bread, pastries, white rice, and pasta. The items listed above may not be a complete list of foods and beverages to avoid. Contact your dietitian for more information. This information is not intended to replace advice given to you by your health care provider. Make sure you discuss any questions you have with your health care provider. Document Released: 11/01/2015 Document Revised: 12/16/2015 Document Reviewed: 07/22/2014 Elsevier Interactive Patient Education  2018 [employer].

## 2017-10-17 NOTE — Assessment & Plan Note (Signed)
Update a1c, continue metformin.  Given handout on dietary recommendation to combat insulin resistance associated with her PCOS.  Recommend daily exercise.

## 2017-10-17 NOTE — Assessment & Plan Note (Signed)
No current treatment, updated TSH and Free t4.

## 2017-10-17 NOTE — Assessment & Plan Note (Signed)
Given handout on dietary changes/plan.  Check lipid panel.

## 2017-10-18 ENCOUNTER — Other Ambulatory Visit (INDEPENDENT_AMBULATORY_CARE_PROVIDER_SITE_OTHER): Payer: No Typology Code available for payment source

## 2017-10-18 ENCOUNTER — Other Ambulatory Visit: Payer: Self-pay

## 2017-10-18 DIAGNOSIS — R7989 Other specified abnormal findings of blood chemistry: Secondary | ICD-10-CM

## 2017-10-18 DIAGNOSIS — R7303 Prediabetes: Secondary | ICD-10-CM | POA: Diagnosis not present

## 2017-10-18 DIAGNOSIS — K219 Gastro-esophageal reflux disease without esophagitis: Secondary | ICD-10-CM

## 2017-10-18 DIAGNOSIS — E669 Obesity, unspecified: Secondary | ICD-10-CM

## 2017-10-18 DIAGNOSIS — R109 Unspecified abdominal pain: Secondary | ICD-10-CM

## 2017-10-18 LAB — T4: T4, Total: 11.4 ug/dL (ref 5.1–11.9)

## 2017-10-18 LAB — CBC
HEMATOCRIT: 42 % (ref 36.0–46.0)
HEMOGLOBIN: 13.6 g/dL (ref 12.0–15.0)
MCHC: 32.5 g/dL (ref 30.0–36.0)
MCV: 85.6 fl (ref 78.0–100.0)
Platelets: 254 10*3/uL (ref 150.0–400.0)
RBC: 4.91 Mil/uL (ref 3.87–5.11)
RDW: 15.1 % (ref 11.5–15.5)
WBC: 8.7 10*3/uL (ref 4.0–10.5)

## 2017-10-18 LAB — POCT URINALYSIS DIPSTICK
Bilirubin, UA: NEGATIVE
Blood, UA: POSITIVE
GLUCOSE UA: NEGATIVE
KETONES UA: NEGATIVE
LEUKOCYTES UA: NEGATIVE
Nitrite, UA: NEGATIVE
Protein, UA: POSITIVE
Spec Grav, UA: 1.025 (ref 1.010–1.025)
Urobilinogen, UA: 0.2 E.U./dL
pH, UA: 6 (ref 5.0–8.0)

## 2017-10-18 LAB — POCT GLYCOSYLATED HEMOGLOBIN (HGB A1C): Hemoglobin A1C: 6.5

## 2017-10-18 NOTE — Addendum Note (Signed)
Addended by: Kateri Mc E on: 10/18/2017 11:26 AM   Modules accepted: Orders

## 2017-10-18 NOTE — Addendum Note (Signed)
Addended by: Lynnea Ferrier on: 10/18/2017 10:41 AM   Modules accepted: Orders

## 2017-10-19 LAB — TSH: TSH: 1.23 u[IU]/mL (ref 0.35–4.50)

## 2017-10-19 LAB — LIPID PANEL
CHOL/HDL RATIO: 4
Cholesterol: 182 mg/dL (ref 0–200)
HDL: 41.9 mg/dL (ref >39.00)
LDL CALC: 123 mg/dL — AB (ref 0–99)
NonHDL: 140.2
Triglycerides: 84 mg/dL (ref 0.0–149.0)
VLDL: 16.8 mg/dL (ref 0.0–40.0)

## 2017-10-19 LAB — COMPREHENSIVE METABOLIC PANEL
ALT: 14 U/L (ref 0–35)
AST: 14 U/L (ref 0–37)
Albumin: 3.7 g/dL (ref 3.5–5.2)
Alkaline Phosphatase: 115 U/L (ref 39–117)
BUN: 7 mg/dL (ref 6–23)
CHLORIDE: 101 meq/L (ref 96–112)
CO2: 24 meq/L (ref 19–32)
Calcium: 8.8 mg/dL (ref 8.4–10.5)
Creatinine, Ser: 0.7 mg/dL (ref 0.40–1.20)
GFR: 118.28 mL/min (ref >60.00)
GLUCOSE: 94 mg/dL (ref 70–99)
POTASSIUM: 4.2 meq/L (ref 3.5–5.1)
Sodium: 138 mEq/L (ref 135–145)
Total Bilirubin: 0.3 mg/dL (ref 0.2–1.2)
Total Protein: 7.1 g/dL (ref 6.0–8.3)

## 2017-10-22 ENCOUNTER — Other Ambulatory Visit: Payer: Self-pay | Admitting: Family Medicine

## 2017-10-22 DIAGNOSIS — E282 Polycystic ovarian syndrome: Secondary | ICD-10-CM

## 2017-10-22 MED ORDER — ACCU-CHEK FASTCLIX LANCETS MISC: 3 refills | Status: DC

## 2017-10-22 MED ORDER — SPIRONOLACTONE 50 MG PO TABS: 100.0000 mg | ORAL_TABLET | Freq: Two times a day (BID) | ORAL | 2 refills | Status: DC

## 2017-10-22 MED ORDER — GLUCOSE BLOOD VI STRP: ORAL_STRIP | 3 refills | Status: DC

## 2017-10-22 MED ORDER — METFORMIN HCL ER 500 MG PO TB24: 500.0000 mg | ORAL_TABLET | Freq: Every day | ORAL | 2 refills | Status: DC

## 2017-10-23 ENCOUNTER — Other Ambulatory Visit: Payer: Self-pay | Admitting: Family Medicine

## 2017-10-23 MED ORDER — GLUCOSE BLOOD VI STRP: ORAL_STRIP | 3 refills | Status: DC

## 2017-10-23 MED ORDER — FREESTYLE SYSTEM KIT: PACK | 0 refills | Status: DC

## 2017-10-23 MED FILL — METFORMIN HCL ER 500 MG TAB: 500 | 90 days supply | Qty: 90 | Fill #0

## 2017-10-23 MED FILL — FREESTYLE LANCETS: 90 days supply | Qty: 100 | Fill #0

## 2017-10-23 MED FILL — SPIRONOLACTONE 50 MG TABLET: 50 | 45 days supply | Qty: 180 | Fill #0

## 2017-10-23 MED FILL — FREESTYLE LITE TEST STRIP: 90 days supply | Qty: 100 | Fill #0

## 2017-10-23 MED FILL — FREESTYLE LITE METER: 1 days supply | Qty: 1 | Fill #0

## 2017-12-05 MED FILL — PANTOPRAZOLE SOD DR 40 MG T: 40 | 30 days supply | Qty: 30 | Fill #1

## 2018-01-11 MED FILL — PANTOPRAZOLE SOD DR 40 MG T: 40 | 30 days supply | Qty: 30 | Fill #2

## 2018-02-11 MED FILL — SETLAKIN 0.15 MG-0.03 MG TA: 0.15-0.03 | 91 days supply | Qty: 91 | Fill #1

## 2018-02-11 MED FILL — METFORMIN HCL ER 500 MG TAB: 500 | 90 days supply | Qty: 90 | Fill #1

## 2018-02-14 ENCOUNTER — Other Ambulatory Visit: Payer: Self-pay | Admitting: Family Medicine

## 2018-02-14 MED ORDER — PANTOPRAZOLE SODIUM 40 MG PO TBEC: 40.0000 mg | DELAYED_RELEASE_TABLET | Freq: Every day | ORAL | 2 refills | Status: DC

## 2018-02-14 MED FILL — PANTOPRAZOLE SOD DR 40 MG T: 40 | 90 days supply | Qty: 90 | Fill #0

## 2018-03-27 ENCOUNTER — Encounter (INDEPENDENT_AMBULATORY_CARE_PROVIDER_SITE_OTHER): Payer: Self-pay

## 2018-04-04 ENCOUNTER — Encounter (INDEPENDENT_AMBULATORY_CARE_PROVIDER_SITE_OTHER): Payer: Self-pay

## 2018-04-04 ENCOUNTER — Ambulatory Visit (INDEPENDENT_AMBULATORY_CARE_PROVIDER_SITE_OTHER): Payer: Self-pay | Admitting: Family Medicine

## 2018-05-08 MED FILL — SETLAKIN 0.15 MG-0.03 MG TA: 0.15-0.03 | 91 days supply | Qty: 91 | Fill #2

## 2018-06-07 MED FILL — VALACYCLOVIR HCL 500 MG TAB: 500 | 3 days supply | Qty: 20 | Fill #1

## 2018-06-07 MED FILL — metFORMIN HCL ER 500 MG TB2: 500 | 90 days supply | Qty: 90 | Fill #2

## 2018-06-07 MED FILL — SPIRONOLACTONE 50 MG TABLET: 50 | 45 days supply | Qty: 180 | Fill #1

## 2018-08-07 ENCOUNTER — Other Ambulatory Visit: Payer: Self-pay | Admitting: Family Medicine

## 2018-08-07 DIAGNOSIS — K219 Gastro-esophageal reflux disease without esophagitis: Secondary | ICD-10-CM

## 2018-08-07 MED ORDER — PANTOPRAZOLE SODIUM 40 MG PO TBEC: 40.0000 mg | DELAYED_RELEASE_TABLET | Freq: Every day | ORAL | 3 refills | Status: DC

## 2018-08-07 MED FILL — PANTOPRAZOLE SOD DR 40 MG T: 40 | 90 days supply | Qty: 90 | Fill #0

## 2018-08-07 NOTE — Progress Notes (Signed)
Pt requested refill of protonix. Rx sent to pharm

## 2018-08-23 ENCOUNTER — Ambulatory Visit (INDEPENDENT_AMBULATORY_CARE_PROVIDER_SITE_OTHER): Payer: No Typology Code available for payment source | Admitting: Family Medicine

## 2018-08-23 ENCOUNTER — Encounter: Payer: Self-pay | Admitting: Family Medicine

## 2018-08-23 VITALS — BP 126/88 | HR 112 | Temp 99.0°F | Ht 68.0 in | Wt 198.0 lb

## 2018-08-23 DIAGNOSIS — M549 Dorsalgia, unspecified: Secondary | ICD-10-CM | POA: Diagnosis not present

## 2018-08-23 DIAGNOSIS — J101 Influenza due to other identified influenza virus with other respiratory manifestations: Secondary | ICD-10-CM

## 2018-08-23 DIAGNOSIS — R52 Pain, unspecified: Secondary | ICD-10-CM | POA: Diagnosis not present

## 2018-08-23 LAB — POC INFLUENZA A&B (BINAX/QUICKVUE)
Influenza A, POC: NEGATIVE
Influenza B, POC: POSITIVE — AB

## 2018-08-23 MED ORDER — CYCLOBENZAPRINE HCL 10 MG PO TABS: 10.0000 mg | ORAL_TABLET | Freq: Every day | ORAL | 0 refills | Status: AC

## 2018-08-23 MED ORDER — OSELTAMIVIR PHOSPHATE 75 MG PO CAPS: 75.0000 mg | ORAL_CAPSULE | Freq: Two times a day (BID) | ORAL | 0 refills | Status: AC

## 2018-08-23 MED ORDER — IBUPROFEN 800 MG PO TABS: 800.0000 mg | ORAL_TABLET | Freq: Three times a day (TID) | ORAL | 0 refills | Status: DC | PRN

## 2018-08-23 MED FILL — OSELTAMIVIR PHOS 75 MG CAP: 75 | 5 days supply | Qty: 10 | Fill #0

## 2018-08-23 MED FILL — CYCLOBENZAPRINE HCL 10 MG T: 10 | 30 days supply | Qty: 30 | Fill #0

## 2018-08-23 MED FILL — IBUPROFEN 800 MG TAB: 800 | 30 days supply | Qty: 90 | Fill #0

## 2018-08-23 NOTE — Progress Notes (Signed)
Cheryl Welch is a 43 y.o. female  Chief Complaint  Patient presents with  . Generalized Body Aches    Patient is here today C/O body aches.  Coughing and has fits of cough and is non productive.  States her head is killing her wth pressure that is frontal and temporal.  Sx started yesterday morning.  Has a pain in her left side that has been present for 1.5 weeks that she is not sure is muscular or is lung involvement but it hurts to sit.    HPI: Cheryl Welch is a 43 y.o. female complains of body aches, cough, subjective fever, headache x 2 days. No ear pain. She had a sore throat yesterday that has resolved. Cough is mild, non-productive.   Pt also complains of Lt side pain x 1-1.5 wks, not worsening but not improving. Pain worse with certain movements (bending to Rt, bending forward), laying on Lt side, taking a deep breath. No dysuria, urgency, frequency. Pt notes a h/o microscopic hematuria that was previously evaluated but nephrology and determined to be benign. No h/o kidney stones. No nausea. No injury or trauma. She has been taking ibuprofen without improvement.  Past Medical History:  Diagnosis Date  . Abnormal Pap smear    2004; LGSIL??>colpo>nml  . Abnormal thyroid function test   . GERD (gastroesophageal reflux disease)   . Hirsutism   . HSV infection   . PCO (polycystic ovaries)   . Prediabetes     Past Surgical History:  Procedure Laterality Date  . CESAREAN SECTION  1994    Social History   Socioeconomic History  . Marital status: Married    Spouse name: Not on file  . Number of children: Not on file  . Years of education: Not on file  . Highest education level: Not on file  Occupational History  . 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 Financial resource strain: Not on file  . Food insecurity:    Worry: Not on file    Inability: Not on file  . Transportation needs:    Medical: Not on file    Non-medical: Not on file    Tobacco Use  . Smoking status: Never Smoker  . Smokeless tobacco: Never Used  Substance and Sexual Activity  . Alcohol use: Yes    Comment: occassional  . Drug use: No  . Sexual activity: Yes    Partners: Male    Birth control/protection: Pill  Lifestyle  . Physical activity:    Days per week: Not on file    Minutes per session: Not on file  . Stress: Not on file  Relationships  . Social connections:    Talks on phone: Not on file    Gets together: Not on file    Attends religious service: Not on file    Active member of club or organization: Not on file    Attends meetings of clubs or organizations: Not on file    Relationship status: Not on file  . Intimate partner violence:    Fear of current or ex partner: Not on file    Emotionally abused: Not on file    Physically abused: Not on file    Forced sexual activity: Not on file  Other Topics Concern  . Not on file  Social History Narrative  . Not on file    Family History  Problem Relation Age of Onset  . Cancer Father  kidney  . Hypertension Father   . Colon polyps Father   . Hypertension Mother   . Diabetes Maternal Grandmother       There is no immunization history on file for this patient.  Outpatient Encounter Medications as of 08/23/2018  Medication Sig  . glucose blood (FREESTYLE TEST STRIPS) test strip Use to check blood glucose once daily  . glucose monitoring kit (FREESTYLE) monitoring kit Use to check blood glucose once daily.  Marland Kitchen levonorgestrel-ethinyl estradiol (SEASONALE,INTROVALE,JOLESSA) 0.15-0.03 MG tablet Take 1 tablet by mouth daily.  . metFORMIN (GLUCOPHAGE XR) 500 MG 24 hr tablet Take 1 tablet (500 mg total) by mouth daily with breakfast.  . pantoprazole (PROTONIX) 40 MG tablet Take 1 tablet (40 mg total) by mouth daily.  Marland Kitchen spironolactone (ALDACTONE) 50 MG tablet Take 2 tablets (100 mg total) by mouth 2 (two) times daily.  . valACYclovir (VALTREX) 500 MG tablet Take 4 tablets (2,000 mg  total) by mouth 2 (two) times daily. 4 tabs now and 4 tabs 12 hrs later. Take 1 tablet daily PRN for suppression.  . cyclobenzaprine (FLEXERIL) 10 MG tablet Take 1 tablet (10 mg total) by mouth at bedtime for 30 days.  Marland Kitchen ibuprofen (ADVIL,MOTRIN) 800 MG tablet Take 1 tablet (800 mg total) by mouth every 8 (eight) hours as needed.  Marland Kitchen oseltamivir (TAMIFLU) 75 MG capsule Take 1 capsule (75 mg total) by mouth 2 (two) times daily for 5 days.   No facility-administered encounter medications on file as of 08/23/2018.      ROS: Pertinent positives and negatives noted in HPI. Remainder of ROS non-contributory    No Known Allergies  BP 126/88 (BP Location: Left Arm, Patient Position: Sitting, Cuff Size: Large)   Pulse (!) 112   Temp 99 F (37.2 C) (Oral)   Ht _0  (1.727 m)   Wt 198 lb (89.8 kg)   SpO2 98%   BMI 30.11 kg/m   Physical Exam  Constitutional: She is oriented to person, place, and time. She appears well-developed and well-nourished.  Pt appears fatigued    HENT:  Head: Normocephalic and atraumatic.  Right Ear: Tympanic membrane and ear canal normal.  Left Ear: Tympanic membrane and ear canal normal.  Nose: Rhinorrhea present. No mucosal edema. Right sinus exhibits no maxillary sinus tenderness and no frontal sinus tenderness. Left sinus exhibits no maxillary sinus tenderness and no frontal sinus tenderness.  Mouth/Throat: Oropharynx is clear and moist and mucous membranes are normal.  Neck: Neck supple.  Cardiovascular: Normal rate, regular rhythm and normal heart sounds.  No murmur heard. Pulmonary/Chest: Effort normal and breath sounds normal. No respiratory distress. She has no wheezes. She has no rhonchi.  Abdominal: Soft. Bowel sounds are normal. There is no abdominal tenderness. There is no CVA tenderness.  Lymphadenopathy:    She has no cervical adenopathy.  Neurological: She is alert and oriented to person, place, and time.  Skin: Skin is warm and dry.    Psychiatric: She has a normal mood and affect.     A/P:  1. Body aches 2. Influenza B - POC Influenza A&B (Binax test) - positive for flu B Rx: - oseltamivir (TAMIFLU) 75 MG capsule; Take 1 capsule (75 mg total) by mouth 2 (two) times daily for 5 days.  Dispense: 10 capsule; Refill: 0 - supportive care to include increased fluid intake, rest, tylenol or ibuprofen PRN - f/u if symptoms worsen or do not improve in 7-10 days Discussed plan and reviewed medications with patient,  including risks, benefits, and potential side effects. Pt expressed understand. All questions answered.  3. Musculoskeletal back pain - location of pain could indicate kidney stone but pt has been able to tolerate pain and control with ibuprofen, pain is elicited by certain movements. Will treat as MSK etiology x 5-7 days and if no/minimal improvement or if symptoms worsen at any time, will get CT to eval for kidney stone Rx: - cyclobenzaprine (FLEXERIL) 10 MG tablet; Take 1 tablet (10 mg total) by mouth at bedtime for 30 days.  Dispense: 30 tablet; Refill: 0 - ibuprofen (ADVIL,MOTRIN) 800 MG tablet; Take 1 tablet (800 mg total) by mouth every 8 (eight) hours as needed.  Dispense: 90 tablet; Refill: 0 - heating pad TID followed by gentle stretching exercises - f/u if symptoms worsen or do not start to improve in 1 week Discussed plan and reviewed medications with patient, including risks, benefits, and potential side effects. Pt expressed understand. All questions answered.

## 2018-08-30 ENCOUNTER — Other Ambulatory Visit: Payer: Self-pay | Admitting: Family Medicine

## 2018-08-30 DIAGNOSIS — K219 Gastro-esophageal reflux disease without esophagitis: Secondary | ICD-10-CM

## 2018-08-30 DIAGNOSIS — K5900 Constipation, unspecified: Secondary | ICD-10-CM

## 2018-09-02 ENCOUNTER — Telehealth: Payer: Self-pay | Admitting: Internal Medicine

## 2018-09-02 MED FILL — SETLAKIN 0.15 MG-0.03 MG TA: 0.15-0.03 | 91 days supply | Qty: 91 | Fill #3

## 2018-09-02 NOTE — Telephone Encounter (Signed)
No problem.

## 2018-09-02 NOTE — Telephone Encounter (Signed)
Please review previous messages and advise 

## 2018-09-02 NOTE — Telephone Encounter (Signed)
Please review and advise.

## 2018-09-03 NOTE — Telephone Encounter (Signed)
Happy to see her in [facility]. Thanks.

## 2018-09-03 NOTE — Telephone Encounter (Signed)
Left message for patient to call back  

## 2018-09-04 NOTE — Telephone Encounter (Signed)
Called and spoke with patient-patient informed she can make an appt with Dr. Bryan Lemma at her convenience; patient requested to call back and schedule her appt since she has to "clear her appt date/time with my boss"; patient advised she is allowed to do this and was given the number of [phone] to call and schedule her appt; Patient verbalized understanding of information/instructions;  Patient was advised to call back if questions/concerns arise;

## 2018-09-23 ENCOUNTER — Encounter: Payer: Self-pay | Admitting: Gastroenterology

## 2018-09-23 ENCOUNTER — Ambulatory Visit: Payer: No Typology Code available for payment source | Admitting: Gastroenterology

## 2018-09-23 VITALS — BP 134/90 | HR 120 | Ht 68.0 in | Wt 204.5 lb

## 2018-09-23 DIAGNOSIS — R140 Abdominal distension (gaseous): Secondary | ICD-10-CM

## 2018-09-23 DIAGNOSIS — R109 Unspecified abdominal pain: Secondary | ICD-10-CM

## 2018-09-23 DIAGNOSIS — K5900 Constipation, unspecified: Secondary | ICD-10-CM

## 2018-09-23 DIAGNOSIS — K219 Gastro-esophageal reflux disease without esophagitis: Secondary | ICD-10-CM

## 2018-09-23 DIAGNOSIS — B009 Herpesviral infection, unspecified: Secondary | ICD-10-CM | POA: Insufficient documentation

## 2018-09-23 DIAGNOSIS — Z8371 Family history of colonic polyps: Secondary | ICD-10-CM

## 2018-09-23 MED ORDER — SUPREP BOWEL PREP KIT 17.5-3.13-1.6 GM/177ML PO SOLN: 1.0000 | ORAL | 0 refills | Status: DC

## 2018-09-23 MED FILL — SUPREP BOWEL PREP KIT: 17.5-3.13-1 | 1 days supply | Qty: 354 | Fill #0

## 2018-09-23 NOTE — Progress Notes (Signed)
Chief Complaint: GERD, constipation, bloating, abdominal pain  Referring Provider:     Ronnald Nian, DO   HPI:    Cheryl Welch is a 43 y.o. female with a history of GERD, PCOS, DM referred to the [facility] for evaluation of ongoing reflux symptoms along with constipation, bloating, and abdominal pain.  She was seen by Alonza Bogus for similar symptoms in 01/2017.  For her GERD symptoms, she has been taking Protonix as prescribed. Index sxs of HB, regurgitation, waterbrash, and post prandial cough. No dysphagia. Breakthrough sxs requriing intermittent Tums and will have breakthrough with single missed Protonix dose. Takes appropriately pre-breakfast.   Constipation has been present for many years c/b hemorrhoids. Will occastionally use stool softeners. Had trialed Amitiza years ago- unsure how efficacious and only used for a short duration. Index sxs of straining to have BM then days without BM. No hematochezia. Hemorrhoids palpable and incomplete cleaning of area, but o/w not painful and without bleeding  Additionally, endorses longstanding history of intermittent abdominal bloating and intermittent visible distention.  Left-sided abdominal pain for the lats month or so. LUQ>LLQ. Worse laying on left side. No preceding events. No change with BM (even took laxative to test this association). No change with PO intake.   Labs in 09/2017 notable for normal CMP and CBC.  No recent abdominal imaging for review today.  No previous EGD/Colonoscopy.   FHx n/f: Father: Colon polyps which required surgical resection (unsure of pathology); esophageal strictures requiring dilations   Past Medical History:  Diagnosis Date  . Abnormal Pap smear    2004; LGSIL??>colpo>nml  . Abnormal thyroid function test   . GERD (gastroesophageal reflux disease)   . Hirsutism   . HSV infection   . PCO (polycystic ovaries)   . Prediabetes      Past Surgical History:    Procedure Laterality Date  . CESAREAN SECTION  1994   Family History  Problem Relation Age of Onset  . Cancer Father        kidney  . Hypertension Father   . Colon polyps Father   . Other Father        esophageal stricture  . Hypertension Mother   . Diabetes Maternal Grandmother   . Colon cancer Neg Hx   . Esophageal cancer Neg Hx   . Breast cancer Neg Hx    Social History   Tobacco Use  . Smoking status: Never Smoker  . Smokeless tobacco: Never Used  Substance Use Topics  . Alcohol use: Yes    Comment: occassional  . Drug use: No   Current Outpatient Medications  Medication Sig Dispense Refill  . glucose blood (FREESTYLE TEST STRIPS) test strip Use to check blood glucose once daily 100 each 3  . glucose monitoring kit (FREESTYLE) monitoring kit Use to check blood glucose once daily. 1 each 0  . ibuprofen (ADVIL,MOTRIN) 800 MG tablet Take 1 tablet (800 mg total) by mouth every 8 (eight) hours as needed. 90 tablet 0  . levonorgestrel-ethinyl estradiol (SEASONALE,INTROVALE,JOLESSA) 0.15-0.03 MG tablet Take 1 tablet by mouth daily. 1 Package 3  . metFORMIN (GLUCOPHAGE XR) 500 MG 24 hr tablet Take 1 tablet (500 mg total) by mouth daily with breakfast. 90 tablet 2  . pantoprazole (PROTONIX) 40 MG tablet Take 1 tablet (40 mg total) by mouth daily. 90 tablet 3  . spironolactone (ALDACTONE) 50 MG tablet Take 2 tablets (100 mg total)  by mouth 2 (two) times daily. 180 tablet 2  . valACYclovir (VALTREX) 500 MG tablet Take 4 tablets (2,000 mg total) by mouth 2 (two) times daily. 4 tabs now and 4 tabs 12 hrs later. Take 1 tablet daily PRN for suppression. 20 tablet 6   No current facility-administered medications for this visit.    No Known Allergies   Review of Systems: All systems reviewed and negative except where noted in HPI.     Physical Exam:    Wt Readings from Last 3 Encounters:  09/23/18 204 lb 8 oz (92.8 kg)  08/23/18 198 lb (89.8 kg)  10/17/17 208 lb 6.4 oz (94.5  kg)    BP 134/90   Pulse (!) 120   Ht _0  (1.727 m)   Wt 204 lb 8 oz (92.8 kg)   BMI 31.09 kg/m  Constitutional:  Pleasant, in no acute distress. Psychiatric: Normal mood and affect. Behavior is normal. EENT: Pupils normal.  Conjunctivae are normal. No scleral icterus. Neck supple. No cervical LAD. Cardiovascular: Normal rate, regular rhythm. No edema Pulmonary/chest: Effort normal and breath sounds normal. No wheezing, rales or rhonchi. Abdominal: Mild TTP in LUQ, particular over left costal margins.   - Carnett's sign.  No rebound or guarding.  No peritoneal signs.  No splenomegaly.  Otherwise soft, nondistended. Bowel sounds active throughout. There are no masses palpable. No hepatomegaly. Neurological: Alert and oriented to person place and time. Skin: Skin is warm and dry. No rashes noted.   ASSESSMENT AND PLAN;   Cheryl Welch is a 43 y.o. female presenting with:  1) GERD: Longstanding history of reflux, requiring daily PPI therapy, with increasing breakthrough symptoms lately.  -Resume Protonix as prescribed.  We did discuss increasing to short course of high-dose PPI for diagnostic and therapeutic intent. - EGD to evaluate for LES laxity, hiatal hernia and for evidence of erosive esophagitis  2) LUQ pain: Clinical presentation and exam seem most consistent with MSK etiology.  No improvement with previous trial of NSAIDs.  Can evaluate for mucosal/luminal etiology at time of EGD as above, with random and directed gastric biopsies.  3) Constipation: No previous colonoscopy.  Longstanding history of chronic constipation.  - Evaluate for luminal/mucosal etiology with colonoscopy -If unrevealing, plan to add Amitiza, Linzess, etc. -Continue increased daily fluid intake - Increase dietary fiber.  Provided with fiber chart today.  Can add fiber supplement as needed for goal of soft, regular stools -If colonoscopy unrevealing and still with ongoing symptoms, can consider  ARM versus sitz marker study  4) Family history of advanced polyps: Father with a history of suspected advanced colon polyps that required surgical resection.  While the histology, size, location, etc. is unknown, the fact that this requires surgical resection does allude to at least some advanced nature to the polyp.  Therefore, based on current screening guidelines, meets criteria for early colon cancer screening starting at age 9.  If colonoscopy otherwise negative, can return to general screening intervals.  - Schedule for colonoscopy  5) Bloating: -EGD with random and directed duodenal biopsies - If evaluation unrevealing, will encourage/start low FODMAP diet  The indications, risks, and benefits of EGD and colonoscopy were explained to the patient in detail. Risks include but are not limited to bleeding, perforation, adverse reaction to medications, and cardiopulmonary compromise. Sequelae include but are not limited to the possibility of surgery, hositalization, and mortality. The patient verbalized understanding and wished to proceed. All questions answered, referred to scheduler and bowel  prep ordered. Further recommendations pending results of the exam.    Lavena Bullion, DO, FACG  09/23/2018, 9:54 AM   Cheryl Welch, Cheryl Fila, DO

## 2018-09-23 NOTE — Patient Instructions (Addendum)
If you are age 43 or older, your body mass index should be between 23-30. Your Body mass index is 31.09 kg/m. If this is out of the aforementioned range listed, please consider follow up with your Primary Care Provider.  If you are age 56 or younger, your body mass index should be between 19-25. Your Body mass index is 31.09 kg/m. If this is out of the aformentioned range listed, please consider follow up with your Primary Care Provider.   You have been scheduled for an endoscopy and colonoscopy. Please follow the written instructions given to you at your visit today. Please pick up your prep supplies at the pharmacy within the next 1-3 days. If you use inhalers (even only as needed), please bring them with you on the day of your procedure. Your physician has requested that you go to www.startemmi.com and enter the access code given to you at your visit today. This web site gives a general overview about your procedure. However, you should still follow specific instructions given to you by our office regarding your preparation for the procedure.   We have sent the following medications to your pharmacy for you to pick up at your convenience: Suprep   Fiber Content in Foods  See the following list for the dietary fiber content of some common foods. High-fiber foods High-fiber foods contain 4 grams or more (4g or more) of fiber per serving. They include:  Artichoke (fresh) - 1 medium has 10.3g of fiber.  Baked beans, plain or vegetarian (canned) -  cup has 5.2g of fiber.  Blackberries or raspberries (fresh) -  cup has 4g of fiber.  Bran cereal -  cup has 8.6g of fiber.  Bulgur (cooked) -  cup has 4g of fiber.  Kidney beans (canned) -  cup has 6.8g of fiber.  Lentils (cooked) -  cup has 7.8g of fiber.  Pear (fresh) - 1 medium has 5.1g of fiber.  Peas (frozen) -  cup has 4.4g of fiber.  Pinto beans (canned) -  cup has 5.5g of fiber.  Pinto beans (dried and cooked) -   cup has 7.7g of fiber.  Potato with skin (baked) - 1 medium has 4.4g of fiber.  Quinoa (cooked) -  cup has 5g of fiber.  Soybeans (canned, frozen, or fresh) -  cup has 5.1g of fiber. Moderate-fiber foods Moderate-fiber foods contain 1-4 grams (1-4g) of fiber per serving. They include:  Almonds - 1 oz. has 3.5g of fiber.  Apple with skin - 1 medium has 3.3g of fiber.  Applesauce, sweetened -  cup has 1.5g of fiber.  Bagel, plain - one 4-inch (10-cm) bagel has 2g of fiber.  Banana - 1 medium has 3.1g of fiber.  Broccoli (cooked) -  cup has 2.5g of fiber.  Carrots (cooked) -  cup has 2.3g of fiber.  Corn (canned or frozen) -  cup has 2.1g of fiber.  Corn tortilla - one 6-inch (15-cm) tortilla has 1.5g of fiber.  Green beans (canned) -  cup has 2g of fiber.  Instant oatmeal -  cup has about 2g of fiber.  Long-grain brown rice (cooked) - 1 cup has 3.5g of fiber.  Macaroni, enriched (cooked) - 1 cup has 2.5g of fiber.  Melon - 1 cup has 1.4g of fiber.  Multigrain cereal -  cup has about 2-4g of fiber.  Orange - 1 small has 3.1g of fiber.  Potatoes, mashed -  cup has 1.6g of fiber.  Raisins - 1/4 cup  has 1.6g of fiber.  Squash -  cup has 2.9g of fiber.  Sunflower seeds -  cup has 1.1g of fiber.  Tomato - 1 medium has 1.5g of fiber.  Vegetable or soy patty - 1 has 3.4g of fiber.  Whole-wheat bread - 1 slice has 2g of fiber.  Whole-wheat spaghetti -  cup has 3.2g of fiber. Low-fiber foods Low-fiber foods contain less than 1 gram (less than 1g) of fiber per serving. They include:  Egg - 1 large.  Flour tortilla - one 6-inch (15-cm) tortilla.  Fruit juice -  cup.  Lettuce - 1 cup.  Meat, poultry, or fish - 1 oz.  Milk - 1 cup.  Spinach (raw) - 1 cup.  White bread - 1 slice.  White rice -  cup.  Yogurt -  cup. Actual amounts of fiber in foods may be different depending on processing. Talk with your dietitian about how much fiber you  need in your diet. This information is not intended to replace advice given to you by your health care provider. Make sure you discuss any questions you have with your health care provider. Document Released: 11/26/2006 Document Revised: 12/16/2015 Document Reviewed: 09/02/2015 Elsevier Interactive Patient Education  [employer].   It was a pleasure to see you today!  Vito Cirigliano, D.O.

## 2018-10-07 ENCOUNTER — Encounter: Payer: Self-pay | Admitting: Gastroenterology

## 2018-10-16 ENCOUNTER — Encounter: Payer: No Typology Code available for payment source | Admitting: Gastroenterology

## 2018-11-29 MED FILL — PANTOPRAZOLE SOD DR 40 MG T: 40 | 90 days supply | Qty: 90 | Fill #1

## 2018-12-13 ENCOUNTER — Other Ambulatory Visit: Payer: Self-pay | Admitting: Family Medicine

## 2018-12-13 DIAGNOSIS — R7301 Impaired fasting glucose: Secondary | ICD-10-CM

## 2018-12-13 DIAGNOSIS — E282 Polycystic ovarian syndrome: Secondary | ICD-10-CM

## 2018-12-13 DIAGNOSIS — Z8639 Personal history of other endocrine, nutritional and metabolic disease: Secondary | ICD-10-CM

## 2018-12-13 DIAGNOSIS — Z0000 Encounter for general adult medical examination without abnormal findings: Secondary | ICD-10-CM

## 2018-12-13 DIAGNOSIS — E669 Obesity, unspecified: Secondary | ICD-10-CM

## 2018-12-13 DIAGNOSIS — R7989 Other specified abnormal findings of blood chemistry: Secondary | ICD-10-CM

## 2018-12-17 ENCOUNTER — Other Ambulatory Visit: Payer: Self-pay

## 2018-12-17 MED ORDER — LEVONORGEST-ETH ESTRAD 91-DAY 0.15-0.03 MG PO TABS: 1.0000 | ORAL_TABLET | Freq: Every day | ORAL | 3 refills | Status: DC

## 2018-12-17 MED FILL — SETLAKIN 0.15 MG-0.03 MG TA: 0.15-0.03 | 91 days supply | Qty: 91 | Fill #0

## 2018-12-20 ENCOUNTER — Other Ambulatory Visit (INDEPENDENT_AMBULATORY_CARE_PROVIDER_SITE_OTHER): Payer: No Typology Code available for payment source

## 2018-12-20 DIAGNOSIS — E669 Obesity, unspecified: Secondary | ICD-10-CM

## 2018-12-20 DIAGNOSIS — R7989 Other specified abnormal findings of blood chemistry: Secondary | ICD-10-CM | POA: Diagnosis not present

## 2018-12-20 DIAGNOSIS — Z0000 Encounter for general adult medical examination without abnormal findings: Secondary | ICD-10-CM

## 2018-12-20 DIAGNOSIS — R7301 Impaired fasting glucose: Secondary | ICD-10-CM

## 2018-12-20 DIAGNOSIS — E282 Polycystic ovarian syndrome: Secondary | ICD-10-CM

## 2018-12-20 DIAGNOSIS — Z8639 Personal history of other endocrine, nutritional and metabolic disease: Secondary | ICD-10-CM

## 2018-12-20 LAB — LIPID PANEL
Cholesterol: 201 mg/dL — ABNORMAL HIGH (ref 0–200)
HDL: 46 mg/dL (ref >39.00)
LDL Cholesterol: 138 mg/dL — ABNORMAL HIGH (ref 0–99)
NonHDL: 154.96
Total CHOL/HDL Ratio: 4
Triglycerides: 86 mg/dL (ref 0.0–149.0)
VLDL: 17.2 mg/dL (ref 0.0–40.0)

## 2018-12-20 LAB — COMPREHENSIVE METABOLIC PANEL
ALT: 12 U/L (ref 0–35)
AST: 11 U/L (ref 0–37)
Albumin: 4.1 g/dL (ref 3.5–5.2)
Alkaline Phosphatase: 159 U/L — ABNORMAL HIGH (ref 39–117)
BUN: 6 mg/dL (ref 6–23)
CO2: 25 mEq/L (ref 19–32)
Calcium: 9.1 mg/dL (ref 8.4–10.5)
Chloride: 101 mEq/L (ref 96–112)
Creatinine, Ser: 0.75 mg/dL (ref 0.40–1.20)
GFR: 102.19 mL/min (ref >60.00)
Glucose, Bld: 82 mg/dL (ref 70–99)
Potassium: 3.6 mEq/L (ref 3.5–5.1)
Sodium: 138 mEq/L (ref 135–145)
Total Bilirubin: 0.6 mg/dL (ref 0.2–1.2)
Total Protein: 8 g/dL (ref 6.0–8.3)

## 2018-12-20 LAB — VITAMIN D 25 HYDROXY (VIT D DEFICIENCY, FRACTURES): VITD: 12.63 ng/mL — ABNORMAL LOW (ref 30.00–100.00)

## 2018-12-20 LAB — HEMOGLOBIN A1C: Hgb A1c MFr Bld: 6.5 % (ref 4.6–6.5)

## 2018-12-20 LAB — TSH: TSH: 2.56 u[IU]/mL (ref 0.35–4.50)

## 2018-12-20 LAB — T4, FREE: Free T4: 0.88 ng/dL (ref 0.60–1.60)

## 2018-12-20 NOTE — Addendum Note (Signed)
Addended by: Boris Lown B on: 12/20/2018 11:21 AM   Modules accepted: Orders

## 2018-12-25 ENCOUNTER — Encounter: Payer: No Typology Code available for payment source | Admitting: Family Medicine

## 2018-12-25 LAB — SPECIMEN STATUS REPORT

## 2018-12-26 ENCOUNTER — Ambulatory Visit (INDEPENDENT_AMBULATORY_CARE_PROVIDER_SITE_OTHER): Payer: No Typology Code available for payment source | Admitting: Family Medicine

## 2018-12-26 ENCOUNTER — Encounter: Payer: Self-pay | Admitting: Family Medicine

## 2018-12-26 VITALS — BP 140/84 | HR 95 | Temp 98.3°F | Ht 68.0 in | Wt 203.6 lb

## 2018-12-26 DIAGNOSIS — E559 Vitamin D deficiency, unspecified: Secondary | ICD-10-CM

## 2018-12-26 DIAGNOSIS — E282 Polycystic ovarian syndrome: Secondary | ICD-10-CM | POA: Diagnosis not present

## 2018-12-26 DIAGNOSIS — Z0000 Encounter for general adult medical examination without abnormal findings: Secondary | ICD-10-CM

## 2018-12-26 DIAGNOSIS — Z1239 Encounter for other screening for malignant neoplasm of breast: Secondary | ICD-10-CM

## 2018-12-26 DIAGNOSIS — B001 Herpesviral vesicular dermatitis: Secondary | ICD-10-CM

## 2018-12-26 DIAGNOSIS — K219 Gastro-esophageal reflux disease without esophagitis: Secondary | ICD-10-CM

## 2018-12-26 DIAGNOSIS — E119 Type 2 diabetes mellitus without complications: Secondary | ICD-10-CM | POA: Diagnosis not present

## 2018-12-26 LAB — ESTRADIOL: Estradiol: 18 pg/mL

## 2018-12-26 LAB — PROLACTIN: Prolactin: 16.4 ng/mL

## 2018-12-26 LAB — 17-HYDROXYPROGESTERONE: 17-OH-Progesterone, LC/MS/MS: 15 ng/dL

## 2018-12-26 LAB — DHEA-SULFATE: DHEA-SO4: 90 ug/dL (ref 19–231)

## 2018-12-26 LAB — INSULIN, RANDOM: Insulin: 23.1 u[IU]/mL — ABNORMAL HIGH

## 2018-12-26 LAB — FSH/LH
FSH: 0.7 m[IU]/mL — ABNORMAL LOW
LH: <0.2 m[IU]/mL — ABNORMAL LOW

## 2018-12-26 MED ORDER — METFORMIN HCL ER 500 MG PO TB24: 500.0000 mg | ORAL_TABLET | Freq: Every day | ORAL | 3 refills | Status: DC

## 2018-12-26 MED ORDER — VALACYCLOVIR HCL 500 MG PO TABS: 500.0000 mg | ORAL_TABLET | Freq: Every day | ORAL | 3 refills | Status: DC

## 2018-12-26 MED ORDER — SPIRONOLACTONE 50 MG PO TABS: 100.0000 mg | ORAL_TABLET | Freq: Two times a day (BID) | ORAL | 3 refills | Status: DC

## 2018-12-26 MED ORDER — VITAMIN D (ERGOCALCIFEROL) 1.25 MG (50000 UNIT) PO CAPS: 50000.0000 [IU] | ORAL_CAPSULE | ORAL | 2 refills | Status: DC

## 2018-12-26 MED FILL — SPIRONOLACTONE 50 MG TABLET: 50 | 90 days supply | Qty: 360 | Fill #0

## 2018-12-26 MED FILL — VIT D2 1.25 MG (50,000 UNIT: 1.25 MG | 84 days supply | Qty: 12 | Fill #0

## 2018-12-26 MED FILL — VALACYCLOVIR HCL 500 MG TAB: 500 | 90 days supply | Qty: 90 | Fill #0

## 2018-12-26 MED FILL — metFORMIN HCL ER 500 MG TB2: 500 | 90 days supply | Qty: 90 | Fill #0

## 2018-12-26 NOTE — Progress Notes (Signed)
Cheryl Welch is a 43 y.o. female  Chief Complaint  Patient presents with  . Annual Exam    CPE    HPI: Cheryl Welch is a 43 y.o. female here for her annual CPE. She has labs done previously. She needs a referral for a mammogram. PAP is UTD and due in 1 year (pt follows with GYN). Pt does not have any specific issues/concerns today. She does not take metformin or spironolactone consistently.   Last PAP: 01/2017 - normal (with GYN) Last mammo: 04/2017  Diet/Exercise: no regular exercise, diet is "average"  Med refills needed today? See orders  Last menstrual cycle: pt is on OCP and takes them continuously   The 10-year ASCVD risk score Mikey Bussing DC Brooke Bonito., et al., 2013) is: 4.1%   Values used to calculate the score:     Age: 34 years     Sex: Female     Is Non-Hispanic African American: Yes     Diabetic: Yes     Tobacco smoker: No     Systolic Blood Pressure: 062 mmHg     Is BP treated: No     HDL Cholesterol: 46 mg/dL     Total Cholesterol: 201 mg/dL   Past Medical History:  Diagnosis Date  . Abnormal Pap smear    2004; LGSIL??>colpo>nml  . Abnormal thyroid function test   . GERD (gastroesophageal reflux disease)   . Hirsutism   . HSV infection   . PCO (polycystic ovaries)   . Prediabetes     Past Surgical History:  Procedure Laterality Date  . CESAREAN SECTION  1994    Social History   Socioeconomic History  . Marital status: Married    Spouse name: Not on file  . Number of children: 2  . Years of education: Not on file  . Highest education level: Not on file  Occupational History  . Occupation: Producer, television/film/video: [employer]  . Financial resource strain: Not on file  . Food insecurity:    Worry: Not on file    Inability: Not on file  . Transportation needs:    Medical: Not on file    Non-medical: Not on file  Tobacco Use  . Smoking status: Never Smoker  . Smokeless tobacco: Never Used  Substance and Sexual Activity  .  Alcohol use: Yes    Comment: occassional  . Drug use: No  . Sexual activity: Yes    Partners: Male    Birth control/protection: Pill  Lifestyle  . Physical activity:    Days per week: Not on file    Minutes per session: Not on file  . Stress: Not on file  Relationships  . Social connections:    Talks on phone: Not on file    Gets together: Not on file    Attends religious service: Not on file    Active member of club or organization: Not on file    Attends meetings of clubs or organizations: Not on file    Relationship status: Not on file  . Intimate partner violence:    Fear of current or ex partner: Not on file    Emotionally abused: Not on file    Physically abused: Not on file    Forced sexual activity: Not on file  Other Topics Concern  . Not on file  Social History Narrative  . Not on file    Family History  Problem Relation Age of Onset  .  Cancer Father        kidney  . Hypertension Father   . Colon polyps Father   . Other Father        esophageal stricture  . Hypertension Mother   . Diabetes Maternal Grandmother   . Colon cancer Neg Hx   . Esophageal cancer Neg Hx   . Breast cancer Neg Hx      Immunization History  Administered Date(s) Administered  . Tdap 03/27/2007    Outpatient Encounter Medications as of 12/26/2018  Medication Sig  . ibuprofen (ADVIL,MOTRIN) 800 MG tablet Take 1 tablet (800 mg total) by mouth every 8 (eight) hours as needed.  Marland Kitchen levonorgestrel-ethinyl estradiol (SEASONALE) 0.15-0.03 MG tablet Take 1 tablet by mouth daily.  . metFORMIN (GLUCOPHAGE XR) 500 MG 24 hr tablet Take 1 tablet (500 mg total) by mouth daily with breakfast.  . pantoprazole (PROTONIX) 40 MG tablet Take 1 tablet (40 mg total) by mouth daily.  Marland Kitchen spironolactone (ALDACTONE) 50 MG tablet Take 2 tablets (100 mg total) by mouth 2 (two) times daily.  Manus Gunning BOWEL PREP KIT 17.5-3.13-1.6 GM/177ML SOLN Take 1 kit by mouth as directed.  . valACYclovir (VALTREX) 500 MG  tablet Take 1 tablet (500 mg total) by mouth daily.  . [DISCONTINUED] glucose blood (FREESTYLE TEST STRIPS) test strip Use to check blood glucose once daily  . [DISCONTINUED] glucose monitoring kit (FREESTYLE) monitoring kit Use to check blood glucose once daily.  . [DISCONTINUED] metFORMIN (GLUCOPHAGE XR) 500 MG 24 hr tablet Take 1 tablet (500 mg total) by mouth daily with breakfast.  . [DISCONTINUED] spironolactone (ALDACTONE) 50 MG tablet Take 2 tablets (100 mg total) by mouth 2 (two) times daily.  . [DISCONTINUED] valACYclovir (VALTREX) 500 MG tablet Take 4 tablets (2,000 mg total) by mouth 2 (two) times daily. 4 tabs now and 4 tabs 12 hrs later. Take 1 tablet daily PRN for suppression.  . Vitamin D, Ergocalciferol, (DRISDOL) 1.25 MG (50000 UT) CAPS capsule Take 1 capsule (50,000 Units total) by mouth every 7 (seven) days.   No facility-administered encounter medications on file as of 12/26/2018.      ROS: Gen: no fever, chills  Skin: no rash, itching; hair thinning and falling out x years ENT: no ear pain, ear drainage, nasal congestion, rhinorrhea, sinus pressure, sore throat Eyes: no blurry vision, double vision Resp: no cough, wheeze,SOB Breast: no breast tenderness, no nipple discharge, no breast masses CV: no CP, palpitations, LE edema,  GI: no heartburn, n/v/d, abd pain; + constipation GU: no dysuria, urgency, frequency, hematuria; no vaginal itching, odor, discharge MSK: no joint pain, myalgias, back pain Neuro: no dizziness, headache, weakness Psych: no depression, anxiety, insomnia   No Known Allergies  BP 140/84   Pulse 95   Temp 98.3 F (36.8 C) (Oral)   Ht 5' 8"  (1.727 m)   Wt 203 lb 9.6 oz (92.4 kg)   SpO2 99%   BMI 30.96 kg/m   BP Readings from Last 3 Encounters:  12/26/18 140/84  09/23/18 134/90  08/23/18 126/88     Physical Exam  Constitutional: She is oriented to person, place, and time. She appears well-developed and well-nourished. No distress.   HENT:  Head: Normocephalic and atraumatic.  Right Ear: Tympanic membrane and ear canal normal.  Left Ear: Tympanic membrane and ear canal normal.  Nose: Nose normal.  Mouth/Throat: Oropharynx is clear and moist and mucous membranes are normal.  Eyes: Pupils are equal, round, and reactive to light. Conjunctivae are normal.  Neck: Normal range of motion. Neck supple.  Cardiovascular: Normal rate, regular rhythm, normal heart sounds and intact distal pulses.  No murmur heard. Pulmonary/Chest: Breath sounds normal. No respiratory distress.  Abdominal: Soft. Bowel sounds are normal. She exhibits no mass. There is no abdominal tenderness.  Musculoskeletal: Normal range of motion.        General: No edema.  Lymphadenopathy:    She has no cervical adenopathy.  Neurological: She is alert and oriented to person, place, and time.  Skin: Skin is warm and dry.  Psychiatric: She has a normal mood and affect. Her behavior is normal.     A/P:  1. Annual physical exam - PAP UTD, mammo referral placed today - labs done previously and reviewed today - stressed importance of healthy diet as well as regular walking or other CV exercise - pt will get pneumococcal vaccine at next OV in 3 mo, likely also needs Tdap - eye exam scheduled  2. Controlled type 2 diabetes mellitus without complication, without long-term current use of insulin (HCC) - A1C = 6.5 - stressed importance of healthy, low carb and sugar diet as well as regular walking or other CV exercise - eye appt scheduled - recommend ASA 24m daily Rx: - metFORMIN (GLUCOPHAGE XR) 500 MG 24 hr tablet; Take 1 tablet (500 mg total) by mouth daily with breakfast.  Dispense: 90 tablet; Refill: 3 - pt declines statin at this time  - pt would also like to wait on starting ACE or ARB - at next OV in 3 mo, will check A1C, microalbumin/creat ratio, and discuss Rx for ACE or ARB again - f/u in 3 mo or sooner PRN  3. PCOS (polycystic ovarian syndrome)  Refill: - metFORMIN (GLUCOPHAGE XR) 500 MG 24 hr tablet; Take 1 tablet (500 mg total) by mouth daily with breakfast.  Dispense: 90 tablet; Refill: 3 - spironolactone (ALDACTONE) 50 MG tablet; Take 2 tablets (100 mg total) by mouth 2 (two) times daily.  Dispense: 180 tablet; Refill: 3  4. Vitamin D deficiency Rx: - Vitamin D, Ergocalciferol, (DRISDOL) 1.25 MG (50000 UT) CAPS capsule; Take 1 capsule (50,000 Units total) by mouth every 7 (seven) days.  Dispense: 5 capsule; Refill: 2 - once pt completes 12 wks of above Rx, will start 2000IU Vit D daily  5. Gastroesophageal reflux disease, esophagitis presence not specified - cont protonix 44mdaily - due to f/u with GI, pt has to call to schedule  6. Herpes labialis Refill: - valACYclovir (VALTREX) 500 MG tablet; Take 1 tablet (500 mg total) by mouth daily.  Dispense: 90 tablet; Refill: 3  7. Screening for breast cancer - MM DIGITAL SCREENING BILATERAL; Future

## 2018-12-26 NOTE — Patient Instructions (Signed)
Take Vit D 50,000 1 cap per week for 12 weeks then 2000 units daily (OTC)

## 2018-12-27 ENCOUNTER — Encounter: Payer: Self-pay | Admitting: Family Medicine

## 2018-12-27 DIAGNOSIS — E559 Vitamin D deficiency, unspecified: Secondary | ICD-10-CM | POA: Insufficient documentation

## 2018-12-28 LAB — TESTOSTERONE, FREE, TOTAL, SHBG
Sex Hormone Binding: 161.6 nmol/L — ABNORMAL HIGH (ref 24.6–122.0)
Testosterone, Free: 0.5 pg/mL (ref 0.0–4.2)
Testosterone: 19 ng/dL (ref 8–48)

## 2019-02-28 ENCOUNTER — Ambulatory Visit: Payer: No Typology Code available for payment source

## 2019-03-14 MED FILL — SETLAKIN 0.15 MG-0.03 MG TA: 0.15-0.03 | 91 days supply | Qty: 91 | Fill #1

## 2019-03-14 MED FILL — PANTOPRAZOLE SOD DR 40 MG T: 40 | 90 days supply | Qty: 90 | Fill #2

## 2019-04-15 ENCOUNTER — Ambulatory Visit: Payer: No Typology Code available for payment source

## 2019-05-16 MED FILL — SPIRONOLACTONE 50 MG TABLET: 50 | 90 days supply | Qty: 360 | Fill #1

## 2019-05-16 MED FILL — metFORMIN HCL ER 500 MG TB2: 500 | 90 days supply | Qty: 90 | Fill #1

## 2019-05-16 MED FILL — VIT D2 1.25 MG (50,000 UNIT: 1.25 MG | 21 days supply | Qty: 3 | Fill #1

## 2019-05-16 MED FILL — VALACYCLOVIR HCL 500 MG TAB: 500 | 90 days supply | Qty: 90 | Fill #1

## 2019-05-28 ENCOUNTER — Ambulatory Visit
Admission: RE | Admit: 2019-05-28 | Discharge: 2019-05-28 | Disposition: A | Discharge status: Home / Self Care | Payer: No Typology Code available for payment source | Source: Ambulatory Visit | Attending: Family Medicine | Admitting: Family Medicine

## 2019-05-28 ENCOUNTER — Other Ambulatory Visit: Payer: Self-pay

## 2019-05-28 DIAGNOSIS — Z1239 Encounter for other screening for malignant neoplasm of breast: Secondary | ICD-10-CM

## 2019-07-03 MED FILL — PANTOPRAZOLE SOD DR 40 MG T: 40 | 90 days supply | Qty: 90 | Fill #3

## 2019-09-19 ENCOUNTER — Other Ambulatory Visit: Payer: Self-pay

## 2019-09-19 ENCOUNTER — Ambulatory Visit (INDEPENDENT_AMBULATORY_CARE_PROVIDER_SITE_OTHER): Payer: 59 | Admitting: Family Medicine

## 2019-09-19 ENCOUNTER — Ambulatory Visit (INDEPENDENT_AMBULATORY_CARE_PROVIDER_SITE_OTHER): Payer: 59

## 2019-09-19 ENCOUNTER — Encounter: Payer: Self-pay | Admitting: Family Medicine

## 2019-09-19 VITALS — BP 118/82 | HR 111 | Temp 97.0°F | Ht 68.0 in | Wt 196.8 lb

## 2019-09-19 DIAGNOSIS — M25511 Pain in right shoulder: Secondary | ICD-10-CM

## 2019-09-19 DIAGNOSIS — G8929 Other chronic pain: Secondary | ICD-10-CM | POA: Diagnosis not present

## 2019-09-19 MED ORDER — PREDNISONE 20 MG PO TABS: ORAL_TABLET | ORAL | 0 refills | Status: DC

## 2019-09-19 MED FILL — predniSONE 20 MG TABS: 20 | 12 days supply | Qty: 21 | Fill #0

## 2019-09-19 NOTE — Progress Notes (Signed)
Cheryl Welch is a 44 y.o. female  Chief Complaint  Patient presents with   Shoulder Pain    pt states several months in right shoulder, feels like she pulled something, took tyelonel, motrin and muscle relaxers nothing helps    HPI: Cheryl Welch is a 44 y.o. female Rt shoulder pain x 3+ mo. No specific injury or trauma. Pain at rest and with most motions. At times, shoulder "locks" and gets stuck in motion. Cannot lay on that side comfortably. No radiation of pain. No numbness or tingling.  Pain is dull, achy.  She has tried ibuprofen, tylenol, muscle relaxants.   Past Medical History:  Diagnosis Date   Abnormal Pap smear    2004; LGSIL??>colpo>nml   Abnormal thyroid function test    GERD (gastroesophageal reflux disease)    Hirsutism    HSV infection    PCO (polycystic ovaries)    Prediabetes     Past Surgical History:  Procedure Laterality Date   CESAREAN SECTION  1994    Social History   Socioeconomic History   Marital status: Married    Spouse name: Not on file   Number of children: 2   Years of education: Not on file   Highest education level: Not on file  Occupational History   Occupation: Producer, television/film/video:   Tobacco Use   Smoking status: Never Smoker   Smokeless tobacco: Never Used  Substance and Sexual Activity   Alcohol use: Yes    Comment: occassional   Drug use: No   Sexual activity: Yes    Partners: Male    Birth control/protection: Pill  Other Topics Concern   Not on file  Social History Narrative   Not on file   Social Determinants of Health   Financial Resource Strain:    Difficulty of Paying Living Expenses: Not on file  Food Insecurity:    Worried About Charity fundraiser in the Last Year: Not on file   [employer] of Food in the Last Year: Not on file  Transportation Needs:    Lack of Transportation (Medical): Not on file   Lack of Transportation (Non-Medical): Not on file  Physical  Activity:    Days of Exercise per Week: Not on file   Minutes of Exercise per Session: Not on file  Stress:    Feeling of Stress : Not on file  Social Connections:    Frequency of Communication with Friends and Family: Not on file   Frequency of Social Gatherings with Friends and Family: Not on file   Attends Religious Services: Not on file   Active Member of Clubs or Organizations: Not on file   Attends Archivist Meetings: Not on file   Marital Status: Not on file  Intimate Partner Violence:    Fear of Current or Ex-Partner: Not on file   Emotionally Abused: Not on file   Physically Abused: Not on file   Sexually Abused: Not on file    Family History  Problem Relation Age of Onset   Cancer Father        kidney   Hypertension Father    Colon polyps Father    Other Father        esophageal stricture   Hypertension Mother    Diabetes Maternal Grandmother    Colon cancer Neg Hx    Esophageal cancer Neg Hx    Breast cancer Neg Hx      Immunization  History  Administered Date(s) Administered   Tdap 03/27/2007    Outpatient Encounter Medications as of 09/19/2019  Medication Sig   ibuprofen (ADVIL,MOTRIN) 800 MG tablet Take 1 tablet (800 mg total) by mouth every 8 (eight) hours as needed.   levonorgestrel-ethinyl estradiol (SEASONALE) 0.15-0.03 MG tablet Take 1 tablet by mouth daily.   metFORMIN (GLUCOPHAGE XR) 500 MG 24 hr tablet Take 1 tablet (500 mg total) by mouth daily with breakfast.   pantoprazole (PROTONIX) 40 MG tablet Take 1 tablet (40 mg total) by mouth daily.   spironolactone (ALDACTONE) 50 MG tablet Take 2 tablets (100 mg total) by mouth 2 (two) times daily.   SUPREP BOWEL PREP KIT 17.5-3.13-1.6 GM/177ML SOLN Take 1 kit by mouth as directed.   valACYclovir (VALTREX) 500 MG tablet Take 1 tablet (500 mg total) by mouth daily.   Vitamin D, Ergocalciferol, (DRISDOL) 1.25 MG (50000 UT) CAPS capsule Take 1 capsule (50,000 Units  total) by mouth every 7 (seven) days.   No facility-administered encounter medications on file as of 09/19/2019.     ROS: Pertinent positives and negatives noted in HPI. Remainder of ROS non-contributory  No Known Allergies  BP 118/82 (BP Location: Left Arm, Patient Position: Sitting, Cuff Size: Normal)    Pulse (!) 111    Temp (!) 97 F (36.1 C) (Tympanic)    Ht _0  (1.727 m)    Wt 196 lb 12.8 oz (89.3 kg)    SpO2 97%    BMI 29.92 kg/m   Physical Exam  Constitutional: She is oriented to person, place, and time. She appears well-developed and well-nourished. No distress.  Musculoskeletal:     Right shoulder: Tenderness and pain present. No swelling, bony tenderness or crepitus. Decreased range of motion. Normal strength.  Neurological: She is alert and oriented to person, place, and time. She has normal reflexes. She exhibits normal muscle tone. Coordination normal.  Skin: Skin is warm and dry.  Psychiatric: She has a normal mood and affect. Her behavior is normal.     A/P:  1. Chronic right shoulder pain - heat BID, exercises daily (included in AVS) Rx: - predniSONE (DELTASONE) 20 MG tablet; 3 tabs po x 3 days, 2 tabs po x 3 days, 1 tab po x 3 days, 1/2 tab po x 3 days  Dispense: 21 tablet; Refill: 0 - DG Shoulder Right - will contact pt with xray result  - pending result and pts symptoms after completion of steroid taper, will likely refer to PT and possibly sports med Discussed plan and reviewed medications with patient, including risks, benefits, and potential side effects. Pt expressed understand. All questions answered.  This visit occurred during the SARS-CoV-2 public health emergency.  Safety protocols were in place, including screening questions prior to the visit, additional usage of staff PPE, and extensive cleaning of exam room while observing appropriate contact time as indicated for disinfecting solutions.

## 2019-09-19 NOTE — Patient Instructions (Signed)
Shoulder Range of Motion Exercises Shoulder range of motion (ROM) exercises are done to keep the shoulder moving freely or to increase movement. They are often recommended for people who have shoulder pain or stiffness or who are recovering from a shoulder surgery. Phase 1 exercises When you are able, do this exercise 1-2 times per day for 30-60 seconds in each direction, or as directed by your health care provider. Pendulum exercise To do this exercise while sitting: 1. Sit in a chair or at the edge of your bed with your feet flat on the floor. 2. Let your affected arm hang down in front of you over the edge of the bed or chair. 3. Relax your shoulder, arm, and hand. 4. Rock your body so your arm gently swings in small circles. You can also use your unaffected arm to start the motion. 5. Repeat changing the direction of the circles, swinging your arm left and right, and swinging your arm forward and back. To do this exercise while standing: 1. Stand next to a sturdy chair or table, and hold on to it with your hand on your unaffected side. 2. Bend forward at the waist. 3. Bend your knees slightly. 4. Relax your shoulder, arm, and hand. 5. While keeping your shoulder relaxed, use body motion to swing your arm in small circles. 6. Repeat changing the direction of the circles, swinging your arm left and right, and swinging your arm forward and back. 7. Between exercises, stand up tall and take a short break to relax your lower back.  Phase 2 exercises Do these exercises 1-2 times per day or as told by your health care provider. Hold each stretch for 30 seconds, and repeat 3 times. Do the exercises with one or both arms as instructed by your health care provider. For these exercises, sit at a table with your hand and arm supported by the table. A chair that slides easily or has wheels can be helpful. External rotation 1. Turn your chair so that your affected side is nearest to the  table. 2. Place your forearm on the table to your side. Bend your elbow about 90 at the elbow (right angle) and place your hand palm facing down on the table. Your elbow should be about 6 inches away from your side. 3. Keeping your arm on the table, lean your body forward. Abduction 1. Turn your chair so that your affected side is nearest to the table. 2. Place your forearm and hand on the table so that your thumb points toward the ceiling and your arm is straight out to your side. 3. Slide your hand out to the side and away from you, using your unaffected arm to do the work. 4. To increase the stretch, you can slide your chair away from the table. Flexion: forward stretch 1. Sit facing the table. Place your hand and elbow on the table in front of you. 2. Slide your hand forward and away from you, using your unaffected arm to do the work. 3. To increase the stretch, you can slide your chair backward. Phase 3 exercises Do these exercises 1-2 times per day or as told by your health care provider. Hold each stretch for 30 seconds, and repeat 3 times. Do the exercises with one or both arms as instructed by your health care provider. Cross-body stretch: posterior capsule stretch 1. Lift your arm straight out in front of you. 2. Bend your arm 90 at the elbow (right angle) so your forearm   moves across your body. 3. Use your other arm to gently pull the elbow across your body, toward your other shoulder. Wall climbs 1. Stand with your affected arm extended out to the side with your hand resting on a door frame. 2. Slide your hand slowly up the door frame. 3. To increase the stretch, step through the door frame. Keep your body upright and do not lean. Wand exercises You will need a cane, a piece of PVC pipe, or a sturdy wooden dowel for wand exercises. Flexion To do this exercise while standing: 1. Hold the wand with both of your hands, palms down. 2. Using the other arm to help, lift your arms  up and over your head, if able. 3. Push upward with your other arm to gently increase the stretch. To do this exercise while lying down: 1. Lie on your back with your elbows resting on the floor and the wand in both your hands. Your hands will be palm down, or pointing toward your feet. 2. Lift your hands toward the ceiling, using your unaffected arm to help if needed. 3. Bring your arms overhead as able, using your unaffected arm to help if needed. Internal rotation 1. Stand while holding the wand behind you with both hands. Your unaffected arm should be extended above your head with the arm of the affected side extended behind you at the level of your waist. The wand should be pointing straight up and down as you hold it. 2. Slowly pull the wand up behind your back by straightening the elbow of your unaffected arm and bending the elbow of your affected arm. External rotation 1. Lie on your back with your affected upper arm supported on a small pillow or rolled towel. When you first do this exercise, keep your upper arm close to your body. Over time, bring your arm up to a 90 angle out to the side. 2. Hold the wand across your stomach and with both hands palm up. Your elbow on your affected side should be bent at a 90 angle. 3. Use your unaffected side to help push your forearm away from you and toward the floor. Keep your elbow on your affected side bent at a 90 angle. Contact a health care provider if you have:  New or increasing pain.  New numbness, tingling, weakness, or discoloration in your arm or hand. This information is not intended to replace advice given to you by your health care provider. Make sure you discuss any questions you have with your health care provider. Document Revised: 08/22/2017 Document Reviewed: 08/22/2017 Elsevier Patient Education  2020 [employer]  

## 2019-09-21 ENCOUNTER — Encounter: Payer: Self-pay | Admitting: Family Medicine

## 2019-10-28 ENCOUNTER — Other Ambulatory Visit: Payer: Self-pay | Admitting: Family Medicine

## 2019-10-28 ENCOUNTER — Other Ambulatory Visit: Payer: Self-pay

## 2019-10-28 ENCOUNTER — Telehealth: Payer: Self-pay | Admitting: Family Medicine

## 2019-10-28 DIAGNOSIS — K219 Gastro-esophageal reflux disease without esophagitis: Secondary | ICD-10-CM

## 2019-10-28 MED ORDER — PANTOPRAZOLE SODIUM 40 MG PO TBEC: 40.0000 mg | DELAYED_RELEASE_TABLET | Freq: Every day | ORAL | 3 refills | Status: DC

## 2019-10-28 MED FILL — PANTOPRAZOLE SOD DR 40 MG T: 40 | 90 days supply | Qty: 90 | Fill #0

## 2019-10-28 NOTE — Telephone Encounter (Signed)
Patient is calling requesting a refill for Protonix sent to [facility]. CB is [phone]

## 2019-10-28 NOTE — Telephone Encounter (Signed)
Rx refilled today.

## 2019-10-28 NOTE — Telephone Encounter (Signed)
Last OV 09/19/19 Last fill 08/07/18  #90/3

## 2019-12-19 ENCOUNTER — Other Ambulatory Visit: Payer: Self-pay | Admitting: Family Medicine

## 2019-12-19 ENCOUNTER — Telehealth: Payer: Self-pay

## 2019-12-19 MED ORDER — LEVONORGEST-ETH ESTRAD 91-DAY 0.15-0.03 MG PO TABS: 1.0000 | ORAL_TABLET | Freq: Every day | ORAL | 3 refills | Status: DC

## 2019-12-19 MED FILL — SETLAKIN 0.15 MG-0.03 MG TA: 0.15-0.03 | 91 days supply | Qty: 91 | Fill #0

## 2019-12-19 NOTE — Telephone Encounter (Signed)
Last OV 09/19/19

## 2020-01-07 ENCOUNTER — Other Ambulatory Visit: Payer: Self-pay | Admitting: Family Medicine

## 2020-01-07 DIAGNOSIS — E282 Polycystic ovarian syndrome: Secondary | ICD-10-CM

## 2020-01-13 ENCOUNTER — Telehealth: Payer: Self-pay | Admitting: Family Medicine

## 2020-01-13 DIAGNOSIS — E282 Polycystic ovarian syndrome: Secondary | ICD-10-CM

## 2020-01-13 NOTE — Telephone Encounter (Signed)
Please see message and advise.  Thank you. Pharmacy asking for a new rx to be sent in.

## 2020-01-13 NOTE — Telephone Encounter (Signed)
Pharmacy called for refills of metformin and Spironolactone. Last prescribed on 12/26/2018. Stated last time they sent request on 6/17, it was denied for refill too soon, which seems to have been an error.

## 2020-01-14 ENCOUNTER — Other Ambulatory Visit: Payer: Self-pay | Admitting: Family Medicine

## 2020-01-14 MED ORDER — SPIRONOLACTONE 50 MG PO TABS: 100.0000 mg | ORAL_TABLET | Freq: Two times a day (BID) | ORAL | 3 refills | Status: DC

## 2020-01-14 MED ORDER — METFORMIN HCL ER 500 MG PO TB24: 500.0000 mg | ORAL_TABLET | Freq: Every day | ORAL | 3 refills | Status: DC

## 2020-01-14 MED FILL — metFORMIN HCL ER 500 MG TB2: 500 | 90 days supply | Qty: 90 | Fill #0

## 2020-01-14 MED FILL — SPIRONOLACTONE 50 MG TABLET: 50 | 45 days supply | Qty: 180 | Fill #0

## 2020-01-14 NOTE — Telephone Encounter (Signed)
Both meds sent to pharm on file

## 2020-02-03 MED FILL — PANTOPRAZOLE SOD DR 40 MG T: 40 | 90 days supply | Qty: 90 | Fill #1

## 2020-03-17 MED FILL — SETLAKIN 0.15 MG-0.03 MG TA: 0.15-0.03 | 91 days supply | Qty: 91 | Fill #1

## 2020-04-06 ENCOUNTER — Other Ambulatory Visit: Payer: 59

## 2020-04-06 ENCOUNTER — Other Ambulatory Visit: Payer: Self-pay | Admitting: Critical Care Medicine

## 2020-04-06 DIAGNOSIS — Z20822 Contact with and (suspected) exposure to covid-19: Secondary | ICD-10-CM | POA: Diagnosis not present

## 2020-04-08 LAB — SARS-COV-2, NAA 2 DAY TAT

## 2020-04-08 LAB — NOVEL CORONAVIRUS, NAA: SARS-CoV-2, NAA: NOT DETECTED

## 2020-05-05 MED FILL — PANTOPRAZOLE SOD DR 40 MG T: 40 | 90 days supply | Qty: 90 | Fill #2

## 2020-06-14 MED FILL — SETLAKIN 0.15 MG-0.03 MG TA: 0.15-0.03 | 91 days supply | Qty: 91 | Fill #2

## 2020-06-23 MED FILL — metFORMIN HCL ER 500 MG TB2: 500 | 90 days supply | Qty: 90 | Fill #1

## 2020-06-23 MED FILL — SPIRONOLACTONE 50 MG TABLET: 50 | 45 days supply | Qty: 180 | Fill #1

## 2020-06-29 ENCOUNTER — Other Ambulatory Visit: Payer: Self-pay

## 2020-06-30 ENCOUNTER — Encounter: Payer: Self-pay | Admitting: Family Medicine

## 2020-06-30 ENCOUNTER — Ambulatory Visit (INDEPENDENT_AMBULATORY_CARE_PROVIDER_SITE_OTHER): Payer: 59 | Admitting: Family Medicine

## 2020-06-30 ENCOUNTER — Other Ambulatory Visit: Payer: Self-pay | Admitting: Family Medicine

## 2020-06-30 VITALS — BP 152/90 | HR 100 | Temp 98.2°F | Ht 68.0 in | Wt 202.2 lb

## 2020-06-30 DIAGNOSIS — R7989 Other specified abnormal findings of blood chemistry: Secondary | ICD-10-CM | POA: Diagnosis not present

## 2020-06-30 DIAGNOSIS — B001 Herpesviral vesicular dermatitis: Secondary | ICD-10-CM | POA: Diagnosis not present

## 2020-06-30 DIAGNOSIS — E119 Type 2 diabetes mellitus without complications: Secondary | ICD-10-CM

## 2020-06-30 DIAGNOSIS — E282 Polycystic ovarian syndrome: Secondary | ICD-10-CM

## 2020-06-30 DIAGNOSIS — E559 Vitamin D deficiency, unspecified: Secondary | ICD-10-CM

## 2020-06-30 DIAGNOSIS — Z0000 Encounter for general adult medical examination without abnormal findings: Secondary | ICD-10-CM

## 2020-06-30 DIAGNOSIS — Z30019 Encounter for initial prescription of contraceptives, unspecified: Secondary | ICD-10-CM

## 2020-06-30 DIAGNOSIS — K219 Gastro-esophageal reflux disease without esophagitis: Secondary | ICD-10-CM | POA: Diagnosis not present

## 2020-06-30 MED ORDER — SPIRONOLACTONE 100 MG PO TABS: 100.0000 mg | ORAL_TABLET | Freq: Two times a day (BID) | ORAL | 3 refills | Status: DC

## 2020-06-30 MED ORDER — PANTOPRAZOLE SODIUM 40 MG PO TBEC: 40.0000 mg | DELAYED_RELEASE_TABLET | Freq: Every day | ORAL | 3 refills | Status: DC

## 2020-06-30 MED ORDER — LEVONORGEST-ETH ESTRAD 91-DAY 0.15-0.03 MG PO TABS: 1.0000 | ORAL_TABLET | Freq: Every day | ORAL | 3 refills | Status: DC

## 2020-06-30 MED ORDER — METFORMIN HCL ER 500 MG PO TB24: 500.0000 mg | ORAL_TABLET | Freq: Every day | ORAL | 3 refills | Status: DC

## 2020-06-30 MED ORDER — VALACYCLOVIR HCL 500 MG PO TABS: 500.0000 mg | ORAL_TABLET | Freq: Every day | ORAL | 3 refills | Status: DC

## 2020-06-30 MED FILL — VALACYCLOVIR HCL 500 MG TAB: 500 | 90 days supply | Qty: 90 | Fill #0

## 2020-06-30 MED FILL — SPIRONOLACTONE 100 MG TAB: 100 | 90 days supply | Qty: 180 | Fill #0

## 2020-06-30 NOTE — Progress Notes (Signed)
Cheryl Welch is a 44 y.o. female  Chief Complaint  Patient presents with  . Annual Exam    CPE/labs.  not fasting today.  no concerns.      HPI: Cheryl Welch is a 44 y.o. female here for annual CPE. She is not fasting for labs. No acute issues or concerns.   She follows with GYN Dr. Ronita Hipps  Last PAP: due Last mammo: due   Diet/Exercise:  Dental: UTD - need some teeth pulled and replaced Vision: pt wears glasses and has appt 07/15/20  Med refills needed today? Yes see orders  Past Medical History:  Diagnosis Date  . Abnormal Pap smear    2004; LGSIL??>colpo>nml  . Abnormal thyroid function test   . GERD (gastroesophageal reflux disease)   . Hirsutism   . HSV infection   . PCO (polycystic ovaries)   . Prediabetes     Past Surgical History:  Procedure Laterality Date  . CESAREAN SECTION  1994    Social History   Socioeconomic History  . Marital status: Married    Spouse name: Not on file  . Number of children: 2  . Years of education: Not on file  . Highest education level: Not on file  Occupational History  . Occupation: Producer, television/film/video: [employer]  Tobacco Use  . Smoking status: Never Smoker  . Smokeless tobacco: Never Used  Vaping Use  . Vaping Use: Never used  Substance and Sexual Activity  . Alcohol use: Yes    Comment: occassional  . Drug use: No  . Sexual activity: Yes    Partners: Male    Birth control/protection: Pill  Other Topics Concern  . Not on file  Social History Narrative  . Not on file   Social Determinants of Health   Financial Resource Strain:   . Difficulty of Paying Living Expenses: Not on file  Food Insecurity:   . Worried About Charity fundraiser in the Last Year: Not on file  . Ran Out of Food in the Last Year: Not on file  Transportation Needs:   . Lack of Transportation (Medical): Not on file  . Lack of Transportation (Non-Medical): Not on file  Physical Activity:   . Days of Exercise per Week: Not  on file  . Minutes of Exercise per Session: Not on file  Stress:   . Feeling of Stress : Not on file  Social Connections:   . Frequency of Communication with Friends and Family: Not on file  . Frequency of Social Gatherings with Friends and Family: Not on file  . Attends Religious Services: Not on file  . Active Member of Clubs or Organizations: Not on file  . Attends Archivist Meetings: Not on file  . Marital Status: Not on file  Intimate Partner Violence:   . Fear of Current or Ex-Partner: Not on file  . Emotionally Abused: Not on file  . Physically Abused: Not on file  . Sexually Abused: Not on file    Family History  Problem Relation Age of Onset  . Cancer Father        kidney  . Hypertension Father   . Colon polyps Father   . Other Father        esophageal stricture  . Hypertension Mother   . Diabetes Maternal Grandmother   . Colon cancer Neg Hx   . Esophageal cancer Neg Hx   . Breast cancer Neg Hx  Immunization History  Administered Date(s) Administered  . Influenza-Unspecified 05/05/2020  . PFIZER SARS-COV-2 Vaccination 10/03/2019, 10/24/2019  . Tdap 03/27/2007    Outpatient Encounter Medications as of 06/30/2020  Medication Sig  . ibuprofen (ADVIL,MOTRIN) 800 MG tablet Take 1 tablet (800 mg total) by mouth every 8 (eight) hours as needed.  Marland Kitchen levonorgestrel-ethinyl estradiol (SEASONALE) 0.15-0.03 MG tablet Take 1 tablet by mouth daily.  . metFORMIN (GLUCOPHAGE XR) 500 MG 24 hr tablet Take 1 tablet (500 mg total) by mouth daily with breakfast.  . pantoprazole (PROTONIX) 40 MG tablet Take 1 tablet (40 mg total) by mouth daily.  Marland Kitchen spironolactone (ALDACTONE) 50 MG tablet Take 2 tablets (100 mg total) by mouth 2 (two) times daily.  . valACYclovir (VALTREX) 500 MG tablet Take 1 tablet (500 mg total) by mouth daily.  . predniSONE (DELTASONE) 20 MG tablet 3 tabs po x 3 days, 2 tabs po x 3 days, 1 tab po x 3 days, 1/2 tab po x 3 days  . SUPREP BOWEL PREP  KIT 17.5-3.13-1.6 GM/177ML SOLN Take 1 kit by mouth as directed.  . Vitamin D, Ergocalciferol, (DRISDOL) 1.25 MG (50000 UT) CAPS capsule Take 1 capsule (50,000 Units total) by mouth every 7 (seven) days.   No facility-administered encounter medications on file as of 06/30/2020.     ROS: Gen: no fever, chills  Skin: no rash, itching ENT: no ear pain, ear drainage, nasal congestion, rhinorrhea, sinus pressure, sore throat Eyes: no blurry vision, double vision Resp: no cough, wheeze,SOB CV: no CP, palpitations, LE edema,  GI: no heartburn, n/v/d/c, abd pain GU: no dysuria, urgency, frequency, hematuria MSK: no joint pain, myalgias, back pain Neuro: no dizziness, headache, weakness, vertigo Psych: no depression, anxiety, insomnia   No Known Allergies  BP (!) 152/90   Pulse 100   Temp 98.2 F (36.8 C) (Temporal)   Ht 5' 8"  (1.727 m)   Wt 202 lb 3.2 oz (91.7 kg)   SpO2 99%   BMI 30.74 kg/m    BP Readings from Last 3 Encounters:  06/30/20 (!) 152/90  09/19/19 118/82  12/26/18 140/84   Pulse Readings from Last 3 Encounters:  06/30/20 100  09/19/19 (!) 111  12/26/18 95   Wt Readings from Last 3 Encounters:  06/30/20 202 lb 3.2 oz (91.7 kg)  09/19/19 196 lb 12.8 oz (89.3 kg)  12/26/18 203 lb 9.6 oz (92.4 kg)    Physical Exam Constitutional:      General: She is not in acute distress.    Appearance: She is well-developed.  HENT:     Head: Normocephalic and atraumatic.     Right Ear: Tympanic membrane and ear canal normal.     Left Ear: Tympanic membrane and ear canal normal.     Nose: Nose normal.  Eyes:     Conjunctiva/sclera: Conjunctivae normal.     Pupils: Pupils are equal, round, and reactive to light.  Neck:     Thyroid: No thyromegaly.  Cardiovascular:     Rate and Rhythm: Normal rate and regular rhythm.     Heart sounds: Normal heart sounds. No murmur heard.   Pulmonary:     Effort: Pulmonary effort is normal. No respiratory distress.     Breath  sounds: Normal breath sounds. No wheezing or rhonchi.  Abdominal:     General: Bowel sounds are normal. There is no distension.     Palpations: Abdomen is soft. There is no mass.     Tenderness: There is no abdominal  tenderness.  Musculoskeletal:     Cervical back: Neck supple.  Lymphadenopathy:     Cervical: No cervical adenopathy.  Skin:    General: Skin is warm and dry.  Neurological:     Mental Status: She is alert and oriented to person, place, and time.     Motor: No abnormal muscle tone.     Coordination: Coordination normal.  Psychiatric:        Behavior: Behavior normal.      A/P:  1. Annual physical exam - discussed importance of regular CV exercise, healthy diet, adequate sleep - due for PAP, mammo - UTD on dental and vision appt scheduled - CBC; Future - Basic metabolic panel; Future - AST; Future - ALT; Future - next CPE in 1 year  2. Controlled type 2 diabetes mellitus without complication, without long-term current use of insulin (HCC) - controlled - Hemoglobin A1c; Future - Microalbumin / creatinine urine ratio; Future - Lipid panel; Future Restart: - metFORMIN (GLUCOPHAGE XR) 500 MG 24 hr tablet; Take 1 tablet (500 mg total) by mouth daily with breakfast.  Dispense: 90 tablet; Refill: 3  3. Abnormal thyroid blood test - TSH; Future - T4, free; Future  4. Vitamin D deficiency - VITAMIN D 25 Hydroxy (Vit-D Deficiency, Fractures); Future  5. PCOS (polycystic ovarian syndrome) - stable, controlled - spironolactone (ALDACTONE) 100 MG tablet; Take 1 tablet (100 mg total) by mouth 2 (two) times daily.  Dispense: 180 tablet; Refill: 3 - metFORMIN (GLUCOPHAGE XR) 500 MG 24 hr tablet; Take 1 tablet (500 mg total) by mouth daily with breakfast.  Dispense: 90 tablet; Refill: 3  6. Gastroesophageal reflux disease, unspecified whether esophagitis present - controlled, stable Refill: - pantoprazole (PROTONIX) 40 MG tablet; Take 1 tablet (40 mg total) by mouth  daily.  Dispense: 90 tablet; Refill: 3  7. Herpes labialis - stable Refill: - valACYclovir (VALTREX) 500 MG tablet; Take 1 tablet (500 mg total) by mouth daily.  Dispense: 90 tablet; Refill: 3  8. Encounter for female birth control Refill: - levonorgestrel-ethinyl estradiol (SEASONALE) 0.15-0.03 MG tablet; Take 1 tablet by mouth daily.  Dispense: 84 tablet; Refill: 3   This visit occurred during the SARS-CoV-2 public health emergency.  Safety protocols were in place, including screening questions prior to the visit, additional usage of staff PPE, and extensive cleaning of exam room while observing appropriate contact time as indicated for disinfecting solutions.

## 2020-07-29 ENCOUNTER — Telehealth: Payer: 59 | Admitting: Emergency Medicine

## 2020-07-29 ENCOUNTER — Other Ambulatory Visit: Payer: Self-pay | Admitting: Family Medicine

## 2020-07-29 ENCOUNTER — Telehealth: Payer: Self-pay | Admitting: Family Medicine

## 2020-07-29 DIAGNOSIS — J329 Chronic sinusitis, unspecified: Secondary | ICD-10-CM | POA: Diagnosis not present

## 2020-07-29 MED ORDER — FLUCONAZOLE 150 MG PO TABS: 150.0000 mg | ORAL_TABLET | Freq: Once | ORAL | 0 refills | Status: DC

## 2020-07-29 MED ORDER — AMOXICILLIN-POT CLAVULANATE 875-125 MG PO TABS: 1.0000 | ORAL_TABLET | Freq: Two times a day (BID) | ORAL | 0 refills | Status: DC

## 2020-07-29 MED FILL — AMOX-CLAV 875-125 MG TABLET: 875-125 | 7 days supply | Qty: 14 | Fill #0

## 2020-07-29 MED FILL — PANTOPRAZOLE SOD DR 40 MG T: 40 | 90 days supply | Qty: 90 | Fill #0

## 2020-07-29 MED FILL — FLUCONAZOLE 150 MG TABS: 150 | 2 days supply | Qty: 2 | Fill #0

## 2020-07-29 NOTE — Telephone Encounter (Signed)
Patient is calling in regards to getting a prescription called in. She states that she has recently started an antibiotic and would like to have Diflucan sent in. If approved, please send to [facility] and call patient at [phone] to let her know that it has been sent in.

## 2020-07-29 NOTE — Telephone Encounter (Signed)
Rx sent 

## 2020-07-29 NOTE — Progress Notes (Signed)

## 2020-07-30 NOTE — Telephone Encounter (Signed)
Patient notified VIA phone. Dm/cma  

## 2020-09-13 MED FILL — SETLAKIN 0.15 MG-0.03 MG TA: 0.15-0.03 | 91 days supply | Qty: 91 | Fill #0

## 2020-10-27 ENCOUNTER — Other Ambulatory Visit (HOSPITAL_BASED_OUTPATIENT_CLINIC_OR_DEPARTMENT_OTHER): Payer: Self-pay

## 2020-10-27 MED FILL — Metformin HCl Tab ER 24HR 500 MG: ORAL | 90 days supply | Qty: 90 | Fill #0 | Status: AC

## 2020-10-27 MED FILL — Valacyclovir HCl Tab 500 MG: ORAL | 90 days supply | Qty: 90 | Fill #0 | Status: AC

## 2020-10-27 MED FILL — Spironolactone Tab 100 MG: ORAL | 90 days supply | Qty: 180 | Fill #0 | Status: AC

## 2020-10-27 MED FILL — Pantoprazole Sodium EC Tab 40 MG (Base Equiv): ORAL | 90 days supply | Qty: 90 | Fill #0 | Status: AC

## 2020-10-28 ENCOUNTER — Other Ambulatory Visit (HOSPITAL_BASED_OUTPATIENT_CLINIC_OR_DEPARTMENT_OTHER): Payer: Self-pay

## 2020-11-01 ENCOUNTER — Other Ambulatory Visit: Payer: Self-pay

## 2020-11-01 ENCOUNTER — Other Ambulatory Visit (HOSPITAL_COMMUNITY)
Admission: RE | Admit: 2020-11-01 | Discharge: 2020-11-01 | Disposition: A | Discharge status: Home / Self Care | Payer: 59 | Source: Ambulatory Visit | Attending: Obstetrics and Gynecology | Admitting: Obstetrics and Gynecology

## 2020-11-01 ENCOUNTER — Encounter: Payer: Self-pay | Admitting: Obstetrics and Gynecology

## 2020-11-01 ENCOUNTER — Ambulatory Visit (INDEPENDENT_AMBULATORY_CARE_PROVIDER_SITE_OTHER): Payer: 59 | Admitting: Obstetrics and Gynecology

## 2020-11-01 ENCOUNTER — Other Ambulatory Visit: Payer: 59

## 2020-11-01 ENCOUNTER — Ambulatory Visit: Payer: 59 | Admitting: Obstetrics and Gynecology

## 2020-11-01 VITALS — BP 147/89 | HR 101 | Ht 68.0 in | Wt 197.6 lb

## 2020-11-01 DIAGNOSIS — Z01419 Encounter for gynecological examination (general) (routine) without abnormal findings: Secondary | ICD-10-CM | POA: Insufficient documentation

## 2020-11-01 DIAGNOSIS — E282 Polycystic ovarian syndrome: Secondary | ICD-10-CM

## 2020-11-01 NOTE — Progress Notes (Signed)
Subjective:     Cheryl Welch is a 45 y.o. female P2 with BMI 30 with amenorrhea secondary to continuous usage of COC who is here for a comprehensive physical exam. The patient reports no problems. She is sexually active using coc for contraception. She denies pelvic pain or abnormal discharge. She reports occasional dyspareunia secondary to vaginal dryness. She denies urinary incontinence. She is without any complaints. Patient had a normal physical exam with PCP recently and labs are pending. Patient requesting to add insulin resistance labs.    Past Medical History:  Diagnosis Date  . Abnormal Pap smear    2004; LGSIL??>colpo>nml  . Abnormal thyroid function test   . GERD (gastroesophageal reflux disease)   . Hirsutism   . HSV infection   . PCO (polycystic ovaries)   . Prediabetes    Past Surgical History:  Procedure Laterality Date  . CESAREAN SECTION  1994   Family History  Problem Relation Age of Onset  . Cancer Father        kidney  . Hypertension Father   . Colon polyps Father   . Other Father        esophageal stricture  . Hypertension Mother   . Diabetes Maternal Grandmother   . Colon cancer Neg Hx   . Esophageal cancer Neg Hx   . Breast cancer Neg Hx     Social History   Socioeconomic History  . Marital status: Married    Spouse name: Not on file  . Number of children: 2  . Years of education: Not on file  . Highest education level: Not on file  Occupational History  . Occupation: Producer, television/film/video: [employer]  Tobacco Use  . Smoking status: Never Smoker  . Smokeless tobacco: Never Used  Vaping Use  . Vaping Use: Never used  Substance and Sexual Activity  . Alcohol use: Yes    Comment: occassional  . Drug use: No  . Sexual activity: Yes    Partners: Male    Birth control/protection: Pill  Other Topics Concern  . Not on file  Social History Narrative  . Not on file   Social Determinants of Health   Financial Resource Strain: Not on  file  Food Insecurity: Not on file  Transportation Needs: Not on file  Physical Activity: Not on file  Stress: Not on file  Social Connections: Not on file  Intimate Partner Violence: Not on file   Health Maintenance  Topic Date Due  . Hepatitis C Screening  Never done  . PNEUMOCOCCAL POLYSACCHARIDE VACCINE AGE 80-64 HIGH RISK  Never done  . FOOT EXAM  Never done  . OPHTHALMOLOGY EXAM  Never done  . URINE MICROALBUMIN  Never done  . HIV Screening  Never done  . PAP SMEAR-Modifier  05/04/2014  . TETANUS/TDAP  03/26/2017  . HEMOGLOBIN A1C  06/22/2019  . COVID-19 Vaccine (3 - Pfizer risk 4-dose series) 11/21/2019  . INFLUENZA VACCINE  02/21/2021  . HPV VACCINES  Aged Out       Review of Systems Pertinent items noted in HPI and remainder of comprehensive ROS otherwise negative.   Objective:  Blood pressure (!) 147/89, pulse (!) 101, height 5\' 8"  (1.727 m), weight 197 lb 9.6 oz (89.6 kg).     GENERAL: Well-developed, well-nourished female in no acute distress.  HEENT: Normocephalic, atraumatic. Sclerae anicteric.  NECK: Supple. Normal thyroid.  LUNGS: Clear to auscultation bilaterally.  HEART: Regular rate and rhythm. BREASTS: Symmetric in  size. No palpable masses or lymphadenopathy, skin changes, or nipple drainage. ABDOMEN: Soft, nontender, nondistended. No organomegaly. PELVIC: Normal external female genitalia. Vagina is pink and rugated.  Normal discharge. Normal appearing cervix. Uterus is normal in size.  No adnexal mass or tenderness. EXTREMITIES: No cyanosis, clubbing, or edema, 2+ distal pulses.    Assessment:    Healthy female exam.      Plan:    Pap smear collected Vaginal swab collected to rule out BV or yeast as cause for dyspareunia Screening mammogram ordered Patient will be contacted with abnormal results Follow up on labs- patient plans to come in fasting on Wednesday/Thursday See After Visit Summary for Counseling Recommendations

## 2020-11-02 LAB — CERVICOVAGINAL ANCILLARY ONLY
Bacterial Vaginitis (gardnerella): NEGATIVE
Candida Glabrata: NEGATIVE
Candida Vaginitis: NEGATIVE
Comment: NEGATIVE
Comment: NEGATIVE
Comment: NEGATIVE

## 2020-11-03 LAB — CYTOLOGY - PAP
Adequacy: ABSENT
Comment: NEGATIVE
Diagnosis: NEGATIVE
High risk HPV: NEGATIVE

## 2020-11-04 ENCOUNTER — Other Ambulatory Visit: Payer: Self-pay

## 2020-11-04 ENCOUNTER — Other Ambulatory Visit: Payer: 59

## 2020-11-04 DIAGNOSIS — E282 Polycystic ovarian syndrome: Secondary | ICD-10-CM

## 2020-11-06 LAB — INSULIN, 2 HOUR: Insulin, 2 hour: 46.2 u[IU]/mL (ref 0.0–145.4)

## 2020-12-03 MED FILL — Levonorgestrel & Ethinyl Estradiol (91-Day) Tab 0.15-0.03 MG: ORAL | 91 days supply | Qty: 91 | Fill #0 | Status: AC

## 2020-12-06 ENCOUNTER — Other Ambulatory Visit (HOSPITAL_BASED_OUTPATIENT_CLINIC_OR_DEPARTMENT_OTHER): Payer: Self-pay

## 2021-01-13 ENCOUNTER — Other Ambulatory Visit: Payer: Self-pay

## 2021-01-13 ENCOUNTER — Ambulatory Visit
Admission: RE | Admit: 2021-01-13 | Discharge: 2021-01-13 | Disposition: A | Discharge status: Home / Self Care | Payer: 59 | Source: Ambulatory Visit | Attending: Obstetrics and Gynecology | Admitting: Obstetrics and Gynecology

## 2021-01-13 DIAGNOSIS — Z1231 Encounter for screening mammogram for malignant neoplasm of breast: Secondary | ICD-10-CM | POA: Diagnosis not present

## 2021-01-13 DIAGNOSIS — Z01419 Encounter for gynecological examination (general) (routine) without abnormal findings: Secondary | ICD-10-CM

## 2021-01-31 ENCOUNTER — Other Ambulatory Visit (HOSPITAL_BASED_OUTPATIENT_CLINIC_OR_DEPARTMENT_OTHER): Payer: Self-pay

## 2021-01-31 MED FILL — Pantoprazole Sodium EC Tab 40 MG (Base Equiv): ORAL | 90 days supply | Qty: 90 | Fill #1 | Status: AC

## 2021-03-01 ENCOUNTER — Other Ambulatory Visit (HOSPITAL_BASED_OUTPATIENT_CLINIC_OR_DEPARTMENT_OTHER): Payer: Self-pay

## 2021-03-01 ENCOUNTER — Other Ambulatory Visit: Payer: Self-pay | Admitting: Family Medicine

## 2021-03-01 DIAGNOSIS — E282 Polycystic ovarian syndrome: Secondary | ICD-10-CM

## 2021-03-01 MED ORDER — METFORMIN HCL ER 500 MG PO TB24
500.0000 mg | ORAL_TABLET | Freq: Every day | ORAL | 0 refills | Status: DC
  Filled 2021-03-01: qty 90, 90d supply, fill #0

## 2021-03-01 MED FILL — Levonorgestrel & Ethinyl Estradiol (91-Day) Tab 0.15-0.03 MG: ORAL | 91 days supply | Qty: 91 | Fill #1 | Status: AC

## 2021-03-01 MED FILL — Spironolactone Tab 100 MG: ORAL | 90 days supply | Qty: 180 | Fill #1 | Status: AC

## 2021-03-08 DIAGNOSIS — R32 Unspecified urinary incontinence: Secondary | ICD-10-CM

## 2021-04-29 ENCOUNTER — Other Ambulatory Visit (HOSPITAL_BASED_OUTPATIENT_CLINIC_OR_DEPARTMENT_OTHER): Payer: Self-pay

## 2021-04-29 MED FILL — Pantoprazole Sodium EC Tab 40 MG (Base Equiv): ORAL | 90 days supply | Qty: 90 | Fill #2 | Status: CN

## 2021-04-29 MED FILL — Pantoprazole Sodium EC Tab 40 MG (Base Equiv): ORAL | 90 days supply | Qty: 90 | Fill #2 | Status: AC

## 2021-05-13 ENCOUNTER — Encounter: Payer: Self-pay | Admitting: Obstetrics and Gynecology

## 2021-05-13 ENCOUNTER — Other Ambulatory Visit: Payer: Self-pay

## 2021-05-13 ENCOUNTER — Ambulatory Visit (INDEPENDENT_AMBULATORY_CARE_PROVIDER_SITE_OTHER): Payer: 59 | Admitting: Obstetrics and Gynecology

## 2021-05-13 VITALS — BP 162/109 | HR 101 | Ht 67.0 in | Wt 200.0 lb

## 2021-05-13 DIAGNOSIS — R350 Frequency of micturition: Secondary | ICD-10-CM | POA: Diagnosis not present

## 2021-05-13 DIAGNOSIS — N812 Incomplete uterovaginal prolapse: Secondary | ICD-10-CM

## 2021-05-13 DIAGNOSIS — N393 Stress incontinence (female) (male): Secondary | ICD-10-CM

## 2021-05-13 LAB — POCT URINALYSIS DIPSTICK
Appearance: ABNORMAL
Bilirubin, UA: NEGATIVE
Glucose, UA: NEGATIVE
Ketones, UA: NEGATIVE
Leukocytes, UA: NEGATIVE
Nitrite, UA: NEGATIVE
Protein, UA: POSITIVE — AB
Spec Grav, UA: >=1.03 — AB (ref 1.010–1.025)
Urobilinogen, UA: 1 E.U./dL
pH, UA: 6 (ref 5.0–8.0)

## 2021-05-13 NOTE — Progress Notes (Signed)
[facility] New Patient Evaluation and Consultation  Referring Provider: Mora Bellman, MD PCP: Pcp, No Date of Service: 05/13/2021  SUBJECTIVE Chief Complaint: New Patient (Initial Visit) (Cheryl Welch is a 45 y.o. female complains of feeling a buldge./)  History of Present Illness: Cheryl Welch is a 45 y.o. Black or African-American female seen in consultation at the request of Dr. Elly Modena for evaluation of prolapse.    Review of records from Dr Elly Modena significant for: Having a feeling of bulge and protruding from the vagina. Has some leaking with urgency.   Urinary Symptoms: Leaks urine with cough/ sneeze Leaks- hard to tell amount Pad use: 1 liners/ mini-pads per day.   She is bothered by her UI symptoms.  Day time voids 6-7.  Nocturia: 2 times per night to void. Voiding dysfunction: she empties her bladder well.  does not use a catheter to empty bladder.  When urinating, she feels to push on her belly or vagina to empty bladder   UTIs:  0  UTI's in the last year.   Denies history of blood in urine and kidney or bladder stones  Pelvic Organ Prolapse Symptoms:                  She Admits to a feeling of a bulge the vaginal area. It has been present for 6 months.  She Admits to seeing a bulge.  This bulge is bothersome.  Bowel Symptom: Bowel movements: 2 time(s) per week Straining: yes.  Splinting: no.  Incomplete evacuation: yes.  She Denies accidental bowel leakage / fecal incontinence Bowel regimen: stool softener, miralax when needed, tries to drink a lot of water   Sexual Function Sexually active: yes.  Sexual orientation:  heterosexual Pain with sex: No  Pelvic Pain Denies pelvic pain   Past Medical History:  Past Medical History:  Diagnosis Date   Abnormal Pap smear    2004; LGSIL??>colpo>nml   Abnormal thyroid function test    GERD (gastroesophageal reflux disease)    Hirsutism    HSV infection    PCO (polycystic  ovaries)    Prediabetes      Past Surgical History:   Past Surgical History:  Procedure Laterality Date   CESAREAN SECTION  1994     Past OB/GYN History: OB History  Gravida Para Term Preterm AB Living  2 2 2     2   SAB IAB Ectopic Multiple Live Births          2    # Outcome Date GA Lbr Len/2nd Weight Sex Delivery Anes PTL Lv  2 Term 02/16/01 [redacted]w[redacted]d 24:00 5 lb 12 oz (2.608 kg) F VBAC EPI  LIV  1 Term 06/29/93 [redacted]w[redacted]d 06:00 7 lb 6.5 oz (3.359 kg) M CS-Classical EPI  LIV   Menopausal: No Contraception: OCPs, takes continuously. Last pap smear was 11/01/20- neg.  Any history of abnormal pap smears: no.   Medications: She has a current medication list which includes the following prescription(s): ibuprofen, levonorgestrel-ethinyl estradiol, metformin, metformin, pantoprazole, spironolactone, and valacyclovir.   Allergies: Patient has No Known Allergies.   Social History:  Social History   Tobacco Use   Smoking status: Never   Smokeless tobacco: Never  Vaping Use   Vaping Use: Never used  Substance Use Topics   Alcohol use: Yes    Comment: occassional   Drug use: No    Relationship status: married She lives with husband.   She is employed. Regular exercise: No History  of abuse: No  Family History:   Family History  Problem Relation Age of Onset   Cancer Father        kidney   Hypertension Father    Colon polyps Father    Other Father        esophageal stricture   Hypertension Mother    Diabetes Maternal Grandmother    Colon cancer Neg Hx    Esophageal cancer Neg Hx    Breast cancer Neg Hx      Review of Systems: Review of Systems  Constitutional:  Negative for fever, malaise/fatigue and weight loss.  Respiratory:  Negative for cough, shortness of breath and wheezing.   Cardiovascular:  Negative for chest pain, palpitations and leg swelling.  Gastrointestinal:  Negative for abdominal pain and blood in stool.  Genitourinary:  Negative for dysuria.   Musculoskeletal:  Negative for myalgias.  Skin:  Negative for rash.  Neurological:  Negative for dizziness and headaches.  Endo/Heme/Allergies:  Does not bruise/bleed easily.  Psychiatric/Behavioral:  Negative for depression. The patient is not nervous/anxious.     OBJECTIVE Physical Exam: Vitals:   05/13/21 0952  BP: (!) 162/109  Pulse: (!) 101  Weight: 200 lb (90.7 kg)  Height: 5\' 7"  (1.702 m)    Physical Exam Constitutional:      General: She is not in acute distress. Pulmonary:     Effort: Pulmonary effort is normal.  Abdominal:     General: There is no distension.     Palpations: Abdomen is soft.     Tenderness: There is no abdominal tenderness. There is no rebound.  Musculoskeletal:        General: No swelling. Normal range of motion.  Skin:    General: Skin is warm and dry.     Findings: No rash.  Neurological:     Mental Status: She is alert and oriented to person, place, and time.  Psychiatric:        Mood and Affect: Mood normal.        Behavior: Behavior normal.     GU / Detailed Urogynecologic Evaluation:  Pelvic Exam: Normal external female genitalia; Bartholin's and Skene's glands normal in appearance; urethral meatus normal in appearance, no urethral masses or discharge.   CST: negative  Speculum exam reveals normal vaginal mucosa without atrophy. Cervix normal appearance. Uterus normal single, nontender. Adnexa no mass, fullness, tenderness.    Pelvic floor strength II/V  Pelvic floor musculature: Right levator non-tender, Right obturator non-tender, Left levator non-tender, Left obturator non-tender  POP-Q:   POP-Q  -2                                            Aa   -2                                           Ba  2                                              C   5  Gh  3                                            Pb  9                                            tvl   -2                                             Ap  -2                                            Bp  -7                                              D     Rectal Exam:  Normal external rectum  Post-Void Residual (PVR) by Bladder Scan: In order to evaluate bladder emptying, we discussed obtaining a postvoid residual and she agreed to this procedure.  Procedure: The ultrasound unit was placed on the patient's abdomen in the suprapubic region after the patient had voided. A PVR of 30 ml was obtained by bladder scan.  Laboratory Results: POC urine: trace blood, protein   ASSESSMENT AND PLAN Ms. Stiverson is a 45 y.o. with:  1. Uterovaginal prolapse, incomplete   2. Urinary frequency   3. SUI (stress urinary incontinence, female)    Stage I anterior, Stage I posterior, Stage II apical prolapse -For treatment of pelvic organ prolapse, we discussed options for management including expectant management, conservative management, and surgical management, such as Kegels, a pessary, pelvic floor physical therapy, and specific surgical procedures. - She prefers expectant management at this time. Possibly considering surgery in the future.   2. SUI For treatment of stress urinary incontinence,  non-surgical options include expectant management, weight loss, physical therapy, as well as a pessary.  Surgical options include a midurethral sling, Burch urethropexy, and transurethral injection of a bulking agent. - She would like to expectantly manage.   Return as needed  Jaquita Folds, MD   Medical Decision Making:  - Reviewed/ ordered a clinical laboratory test - Review and summation of prior records

## 2021-05-16 ENCOUNTER — Telehealth: Payer: Self-pay | Admitting: Obstetrics and Gynecology

## 2021-05-23 ENCOUNTER — Other Ambulatory Visit (HOSPITAL_BASED_OUTPATIENT_CLINIC_OR_DEPARTMENT_OTHER): Payer: Self-pay

## 2021-05-23 MED FILL — Levonorgestrel & Ethinyl Estradiol (91-Day) Tab 0.15-0.03 MG: ORAL | 91 days supply | Qty: 91 | Fill #2 | Status: AC

## 2021-05-23 NOTE — Telephone Encounter (Signed)
Reviewed

## 2021-07-05 ENCOUNTER — Encounter: Payer: Self-pay | Admitting: Nurse Practitioner

## 2021-07-05 ENCOUNTER — Other Ambulatory Visit: Payer: Self-pay

## 2021-07-05 ENCOUNTER — Other Ambulatory Visit (HOSPITAL_BASED_OUTPATIENT_CLINIC_OR_DEPARTMENT_OTHER): Payer: Self-pay

## 2021-07-05 ENCOUNTER — Ambulatory Visit: Payer: 59 | Admitting: Nurse Practitioner

## 2021-07-05 VITALS — BP 140/98 | HR 101 | Temp 97.2°F | Ht 67.5 in | Wt 202.0 lb

## 2021-07-05 DIAGNOSIS — I10 Essential (primary) hypertension: Secondary | ICD-10-CM

## 2021-07-05 DIAGNOSIS — K219 Gastro-esophageal reflux disease without esophagitis: Secondary | ICD-10-CM

## 2021-07-05 DIAGNOSIS — Z13220 Encounter for screening for lipoid disorders: Secondary | ICD-10-CM

## 2021-07-05 DIAGNOSIS — Z136 Encounter for screening for cardiovascular disorders: Secondary | ICD-10-CM

## 2021-07-05 DIAGNOSIS — E282 Polycystic ovarian syndrome: Secondary | ICD-10-CM | POA: Diagnosis not present

## 2021-07-05 DIAGNOSIS — Z3040 Encounter for surveillance of contraceptives, unspecified: Secondary | ICD-10-CM | POA: Diagnosis not present

## 2021-07-05 DIAGNOSIS — R030 Elevated blood-pressure reading, without diagnosis of hypertension: Secondary | ICD-10-CM | POA: Diagnosis not present

## 2021-07-05 DIAGNOSIS — E039 Hypothyroidism, unspecified: Secondary | ICD-10-CM | POA: Diagnosis not present

## 2021-07-05 DIAGNOSIS — E119 Type 2 diabetes mellitus without complications: Secondary | ICD-10-CM | POA: Diagnosis not present

## 2021-07-05 HISTORY — DX: Essential (primary) hypertension: I10

## 2021-07-05 MED ORDER — SPIRONOLACTONE 100 MG PO TABS
ORAL_TABLET | Freq: Two times a day (BID) | ORAL | 0 refills | Status: DC
  Filled 2021-07-05 – 2021-08-12 (×2): qty 180, 90d supply, fill #0

## 2021-07-05 MED ORDER — PANTOPRAZOLE SODIUM 40 MG PO TBEC
DELAYED_RELEASE_TABLET | Freq: Every day | ORAL | 3 refills | Status: DC
  Filled 2021-07-05: qty 90, fill #0
  Filled 2021-08-12: qty 90, 90d supply, fill #0
  Filled 2021-11-14: qty 90, 90d supply, fill #1
  Filled 2022-02-13: qty 90, 90d supply, fill #2
  Filled 2022-05-09: qty 90, 90d supply, fill #3

## 2021-07-05 MED ORDER — LEVONORGEST-ETH ESTRAD 91-DAY 0.15-0.03 MG PO TABS
1.0000 | ORAL_TABLET | Freq: Every day | ORAL | 3 refills | Status: DC
  Filled 2021-07-05 – 2021-08-18 (×2): qty 91, 91d supply, fill #0
  Filled 2021-11-14: qty 91, 91d supply, fill #1
  Filled 2022-02-07: qty 91, 91d supply, fill #2
  Filled 2022-05-09: qty 91, 91d supply, fill #3

## 2021-07-05 NOTE — Assessment & Plan Note (Addendum)
Does not check BP at home Repeat BP is still elevated, asymtomatic Advised about need for low sodium diet Current use of spironolactone due to hx of PCOS. BP Readings from Last 3 Encounters:  07/05/21 (!) 140/98  05/13/21 (!) 162/109  11/01/20 (!) 147/89   Check BMP and TSH Advised to monitor BP at home and send reading via mychart on Friday.

## 2021-07-05 NOTE — Patient Instructions (Addendum)
Go to lab for blood draw and urine collection. Monitor BP at home daily in AM Send BP readings via mychart on Friday Maintain DASH diet Start daily exercise (cardio 20-60mins and weight training 53mins)  DASH Eating Plan DASH stands for Dietary Approaches to Stop Hypertension. The DASH eating plan is a healthy eating plan that has been shown to: Reduce high blood pressure (hypertension). Reduce your risk for type 2 diabetes, heart disease, and stroke. Help with weight loss. What are tips for following this plan? Reading food labels Check food labels for the amount of salt (sodium) per serving. Choose foods with less than 5 percent of the Daily Value of sodium. Generally, foods with less than 300 milligrams (mg) of sodium per serving fit into this eating plan. To find whole grains, look for the word "whole" as the first word in the ingredient list. Shopping Buy products labeled as "low-sodium" or "no salt added." Buy fresh foods. Avoid canned foods and pre-made or frozen meals. Cooking Avoid adding salt when cooking. Use salt-free seasonings or herbs instead of table salt or sea salt. Check with your health care provider or pharmacist before using salt substitutes. Do not fry foods. Cook foods using healthy methods such as baking, boiling, grilling, roasting, and broiling instead. Cook with heart-healthy oils, such as olive, canola, avocado, soybean, or sunflower oil. Meal planning  Eat a balanced diet that includes: 4 or more servings of fruits and 4 or more servings of vegetables each day. Try to fill one-half of your plate with fruits and vegetables. 6-8 servings of whole grains each day. Less than 6 oz (170 g) of lean meat, poultry, or fish each day. A 3-oz (85-g) serving of meat is about the same size as a deck of cards. One egg equals 1 oz (28 g). 2-3 servings of low-fat dairy each day. One serving is 1 cup (237 mL). 1 serving of nuts, seeds, or beans 5 times each week. 2-3  servings of heart-healthy fats. Healthy fats called omega-3 fatty acids are found in foods such as walnuts, flaxseeds, fortified milks, and eggs. These fats are also found in cold-water fish, such as sardines, salmon, and mackerel. Limit how much you eat of: Canned or prepackaged foods. Food that is high in trans fat, such as some fried foods. Food that is high in saturated fat, such as fatty meat. Desserts and other sweets, sugary drinks, and other foods with added sugar. Full-fat dairy products. Do not salt foods before eating. Do not eat more than 4 egg yolks a week. Try to eat at least 2 vegetarian meals a week. Eat more home-cooked food and less restaurant, buffet, and fast food. Lifestyle When eating at a restaurant, ask that your food be prepared with less salt or no salt, if possible. If you drink alcohol: Limit how much you use to: 0-1 drink a day for women who are not pregnant. 0-2 drinks a day for men. Be aware of how much alcohol is in your drink. In the [place] one drink equals one 12 oz bottle of beer (355 mL), one 5 oz glass of wine (148 mL), or one 1 oz glass of hard liquor (44 mL). General information Avoid eating more than 2,300 mg of salt a day. If you have hypertension, you may need to reduce your sodium intake to 1,500 mg a day. Work with your health care provider to maintain a healthy body weight or to lose weight. Ask what an ideal weight is for you. Get  at least 30 minutes of exercise that causes your heart to beat faster (aerobic exercise) most days of the week. Activities may include walking, swimming, or biking. Work with your health care provider or dietitian to adjust your eating plan to your individual calorie needs. What foods should I eat? Fruits All fresh, dried, or frozen fruit. Canned fruit in natural juice (without added sugar). Vegetables Fresh or frozen vegetables (raw, steamed, roasted, or grilled). Low-sodium or reduced-sodium tomato and vegetable  juice. Low-sodium or reduced-sodium tomato sauce and tomato paste. Low-sodium or reduced-sodium canned vegetables. Grains Whole-grain or whole-wheat bread. Whole-grain or whole-wheat pasta. Brown rice. Modena Morrow. Bulgur. Whole-grain and low-sodium cereals. Pita bread. Low-fat, low-sodium crackers. Whole-wheat flour tortillas. Meats and other proteins Skinless chicken or [place]. Ground chicken or [place]. Pork with fat trimmed off. Fish and seafood. Egg whites. Dried beans, peas, or lentils. Unsalted nuts, nut butters, and seeds. Unsalted canned beans. Lean cuts of beef with fat trimmed off. Low-sodium, lean precooked or cured meat, such as sausages or meat loaves. Dairy Low-fat (1%) or fat-free (skim) milk. Reduced-fat, low-fat, or fat-free cheeses. Nonfat, low-sodium ricotta or cottage cheese. Low-fat or nonfat yogurt. Low-fat, low-sodium cheese. Fats and oils Soft margarine without trans fats. Vegetable oil. Reduced-fat, low-fat, or light mayonnaise and salad dressings (reduced-sodium). Canola, safflower, olive, avocado, soybean, and sunflower oils. Avocado. Seasonings and condiments Herbs. Spices. Seasoning mixes without salt. Other foods Unsalted popcorn and pretzels. Fat-free sweets. The items listed above may not be a complete list of foods and beverages you can eat. Contact a dietitian for more information. What foods should I avoid? Fruits Canned fruit in a light or heavy syrup. Fried fruit. Fruit in cream or butter sauce. Vegetables Creamed or fried vegetables. Vegetables in a cheese sauce. Regular canned vegetables (not low-sodium or reduced-sodium). Regular canned tomato sauce and paste (not low-sodium or reduced-sodium). Regular tomato and vegetable juice (not low-sodium or reduced-sodium). Angie Fava. Olives. Grains Baked goods made with fat, such as croissants, muffins, or some breads. Dry pasta or rice meal packs. Meats and other proteins Fatty cuts of meat. Ribs. Fried meat.  Berniece Salines. Bologna, salami, and other precooked or cured meats, such as sausages or meat loaves. Fat from the back of a pig (fatback). Bratwurst. Salted nuts and seeds. Canned beans with added salt. Canned or smoked fish. Whole eggs or egg yolks. Chicken or [place] with skin. Dairy Whole or 2% milk, cream, and half-and-half. Whole or full-fat cream cheese. Whole-fat or sweetened yogurt. Full-fat cheese. Nondairy creamers. Whipped toppings. Processed cheese and cheese spreads. Fats and oils Butter. Stick margarine. Lard. Shortening. Ghee. Bacon fat. Tropical oils, such as coconut, palm kernel, or palm oil. Seasonings and condiments Onion salt, garlic salt, seasoned salt, table salt, and sea salt. Worcestershire sauce. Tartar sauce. Barbecue sauce. Teriyaki sauce. Soy sauce, including reduced-sodium. Steak sauce. Canned and packaged gravies. Fish sauce. Oyster sauce. Cocktail sauce. Store-bought horseradish. Ketchup. Mustard. Meat flavorings and tenderizers. Bouillon cubes. Hot sauces. Pre-made or packaged marinades. Pre-made or packaged taco seasonings. Relishes. Regular salad dressings. Other foods Salted popcorn and pretzels. The items listed above may not be a complete list of foods and beverages you should avoid. Contact a dietitian for more information. Where to find more information National Heart, Lung, and Blood Institute: https://wilson-eaton.com/ American Heart Association: www.heart.org Academy of Nutrition and Dietetics: www.eatright.[place]: www.kidney.org Summary The DASH eating plan is a healthy eating plan that has been shown to reduce high blood pressure (hypertension). It may also reduce  your risk for type 2 diabetes, heart disease, and stroke. When on the DASH eating plan, aim to eat more fresh fruits and vegetables, whole grains, lean proteins, low-fat dairy, and heart-healthy fats. With the DASH eating plan, you should limit salt (sodium) intake to 2,300 mg a day. If  you have hypertension, you may need to reduce your sodium intake to 1,500 mg a day. Work with your health care provider or dietitian to adjust your eating plan to your individual calorie needs. This information is not intended to replace advice given to you by your health care provider. Make sure you discuss any questions you have with your health care provider. Document Revised: 06/13/2019 Document Reviewed: 06/13/2019 Elsevier Patient Education  2022 [employer].

## 2021-07-05 NOTE — Assessment & Plan Note (Signed)
Repeat hgbA1c and urine microalbumin Decline pneumovax 20 at this time She discontinued metformin. Advised to maintain low sodium, low fat, low carb and low sugar diet Advised to start daily exercise regimen. F/up in 70months

## 2021-07-05 NOTE — Progress Notes (Signed)
Subjective:  Patient ID: Cheryl Welch, female    DOB: [birth year]  Age: 45 y.o. MRN: [id number]  CC: Establish Care (TOC-Dr. C/Pt needs medication refills. (Protonix, Seasonale, and Spironolactone)/)  HPI Transfer care from Cheryl Welch.  Hypothyroidism Repeat TSH No medication use at this time  Controlled type 2 diabetes mellitus without complication, without long-term current use of insulin (HCC) Repeat hgbA1c and urine microalbumin Decline pneumovax 20 at this time She discontinued metformin. Advised to maintain low sodium, low fat, low carb and low sugar diet Advised to start daily exercise regimen. F/up in 67months  Elevated BP without diagnosis of hypertension Does not check BP at home Repeat BP is still elevated, asymtomatic Advised about need for low sodium diet Current use of spironolactone due to hx of PCOS. BP Readings from Last 3 Encounters:  07/05/21 (!) 140/98  05/13/21 (!) 162/109  11/01/20 (!) 147/89   Check BMP and TSH Advised to monitor BP at home and send reading via mychart on Friday.   Gastroesophageal reflux disease Stable with use of pantoprazole Refill sent  Reviewed past Medical, Social and Family history today.  Outpatient Medications Prior to Visit  Medication Sig Dispense Refill   ibuprofen (ADVIL,MOTRIN) 800 MG tablet Take 1 tablet (800 mg total) by mouth every 8 (eight) hours as needed. 90 tablet 0   levonorgestrel-ethinyl estradiol (SEASONALE) 0.15-0.03 MG tablet TAKE 1 TABLET BY MOUTH DAILY. 91 tablet 3   pantoprazole (PROTONIX) 40 MG tablet TAKE 1 TABLET (40 MG TOTAL) BY MOUTH DAILY. 90 tablet 3   metFORMIN (GLUCOPHAGE XR) 500 MG 24 hr tablet Take 1 tablet (500 mg total) by mouth daily with breakfast. (Patient not taking: Reported on 07/05/2021) 90 tablet 3   metFORMIN (GLUCOPHAGE-XR) 500 MG 24 hr tablet Take 1 tablet (500 mg total) by mouth daily with breakfast. **Needs to schedule appt for further refills** (Patient not taking:  Reported on 07/05/2021) 90 tablet 0   spironolactone (ALDACTONE) 100 MG tablet TAKE 1 TABLET (100 MG TOTAL) BY MOUTH 2 (TWO) TIMES DAILY. 180 tablet 3   No facility-administered medications prior to visit.   ROS See HPI  Objective:  BP (!) 140/98 (BP Location: Left Arm, Patient Position: Sitting, Cuff Size: Large)    Pulse (!) 101    Temp (!) 97.2 F (36.2 C) (Temporal)    Ht 5' 7.5" (1.715 m)    Wt 202 lb (91.6 kg)    SpO2 99%    BMI 31.17 kg/m   Physical Exam Vitals reviewed.  Constitutional:      General: She is not in acute distress.    Appearance: She is well-developed.  HENT:     Mouth/Throat:     Pharynx: No oropharyngeal exudate.  Eyes:     Conjunctiva/sclera: Conjunctivae normal.     Pupils: Pupils are equal, round, and reactive to light.  Neck:     Thyroid: No thyroid mass, thyromegaly or thyroid tenderness.  Cardiovascular:     Rate and Rhythm: Normal rate and regular rhythm.     Pulses: Normal pulses.     Heart sounds: Normal heart sounds.  Pulmonary:     Effort: Pulmonary effort is normal. No respiratory distress.     Breath sounds: Normal breath sounds.  Chest:     Chest wall: No tenderness.  Musculoskeletal:        General: Normal range of motion.     Cervical back: Normal range of motion and neck supple.     Right lower  leg: No edema.     Left lower leg: No edema.  Lymphadenopathy:     Cervical: No cervical adenopathy.  Neurological:     Mental Status: She is alert and oriented to person, place, and time.     Deep Tendon Reflexes: Reflexes are normal and symmetric.  Psychiatric:        Mood and Affect: Mood normal.        Behavior: Behavior normal.   Assessment & Plan:  This visit occurred during the SARS-CoV-2 public health emergency.  Safety protocols were in place, including screening questions prior to the visit, additional usage of staff PPE, and extensive cleaning of exam room while observing appropriate contact time as indicated for  disinfecting solutions.   Cheryl Welch was seen today for establish care.  Diagnoses and all orders for this visit:  Controlled type 2 diabetes mellitus without complication, without long-term current use of insulin (HCC) -     Hemoglobin A1c -     Microalbumin / creatinine urine ratio  Encounter for refill of prescription for contraception -     levonorgestrel-ethinyl estradiol (SEASONALE) 0.15-0.03 MG tablet; TAKE 1 TABLET BY MOUTH DAILY.  PCOS (polycystic ovarian syndrome) -     Basic metabolic panel -     spironolactone (ALDACTONE) 100 MG tablet; TAKE 1 TABLET (100 MG TOTAL) BY MOUTH 2 (TWO) TIMES DAILY.  Gastroesophageal reflux disease, unspecified whether esophagitis present -     pantoprazole (PROTONIX) 40 MG tablet; TAKE 1 TABLET (40 MG TOTAL) BY MOUTH DAILY.  Encounter for lipid screening for cardiovascular disease -     Lipid panel  Hypothyroidism, unspecified type -     TSH  Elevated BP without diagnosis of hypertension  Go to lab for blood draw and urine collection. Monitor BP at home daily in AM Send BP readings via mychart on Friday Maintain DASH diet Start daily exercise (cardio 20-77mins and weight training 65mins)  Problem List Items Addressed This Visit       Digestive   Gastroesophageal reflux disease    Stable with use of pantoprazole Refill sent      Relevant Medications   pantoprazole (PROTONIX) 40 MG tablet     Endocrine   Controlled type 2 diabetes mellitus without complication, without long-term current use of insulin (HCC) - Primary    Repeat hgbA1c and urine microalbumin Decline pneumovax 20 at this time She discontinued metformin. Advised to maintain low sodium, low fat, low carb and low sugar diet Advised to start daily exercise regimen. F/up in 35months      Relevant Orders   Hemoglobin A1c   Microalbumin / creatinine urine ratio   Hypothyroidism    Repeat TSH No medication use at this time      Relevant Orders   TSH     Other    Elevated BP without diagnosis of hypertension    Does not check BP at home Repeat BP is still elevated, asymtomatic Advised about need for low sodium diet Current use of spironolactone due to hx of PCOS. BP Readings from Last 3 Encounters:  07/05/21 (!) 140/98  05/13/21 (!) 162/109  11/01/20 (!) 147/89   Check BMP and TSH Advised to monitor BP at home and send reading via mychart on Friday.       Other Visit Diagnoses     Encounter for refill of prescription for contraception       Relevant Medications   levonorgestrel-ethinyl estradiol (SEASONALE) 0.15-0.03 MG tablet   PCOS (polycystic ovarian  syndrome)       Relevant Medications   spironolactone (ALDACTONE) 100 MG tablet   Other Relevant Orders   Basic metabolic panel   Encounter for lipid screening for cardiovascular disease       Relevant Orders   Lipid panel       Follow-up: Return in about 3 months (around 10/03/2021) for CPE (fasting).  Wilfred Lacy, NP

## 2021-07-05 NOTE — Assessment & Plan Note (Signed)
Repeat TSH No medication use at this time

## 2021-07-05 NOTE — Assessment & Plan Note (Signed)
Stable with use of pantoprazole Refill sent

## 2021-07-15 ENCOUNTER — Other Ambulatory Visit (HOSPITAL_BASED_OUTPATIENT_CLINIC_OR_DEPARTMENT_OTHER): Payer: Self-pay

## 2021-07-21 ENCOUNTER — Other Ambulatory Visit (HOSPITAL_BASED_OUTPATIENT_CLINIC_OR_DEPARTMENT_OTHER): Payer: Self-pay

## 2021-07-21 ENCOUNTER — Other Ambulatory Visit (INDEPENDENT_AMBULATORY_CARE_PROVIDER_SITE_OTHER): Payer: 59

## 2021-07-21 ENCOUNTER — Other Ambulatory Visit: Payer: 59

## 2021-07-21 DIAGNOSIS — E119 Type 2 diabetes mellitus without complications: Secondary | ICD-10-CM | POA: Diagnosis not present

## 2021-07-21 DIAGNOSIS — E039 Hypothyroidism, unspecified: Secondary | ICD-10-CM | POA: Diagnosis not present

## 2021-07-21 DIAGNOSIS — Z13220 Encounter for screening for lipoid disorders: Secondary | ICD-10-CM | POA: Diagnosis not present

## 2021-07-21 DIAGNOSIS — Z136 Encounter for screening for cardiovascular disorders: Secondary | ICD-10-CM

## 2021-07-21 DIAGNOSIS — E282 Polycystic ovarian syndrome: Secondary | ICD-10-CM

## 2021-07-21 LAB — BASIC METABOLIC PANEL
BUN: 6 mg/dL (ref 6–23)
CO2: 26 mEq/L (ref 19–32)
Calcium: 8.5 mg/dL (ref 8.4–10.5)
Chloride: 99 mEq/L (ref 96–112)
Creatinine, Ser: 0.66 mg/dL (ref 0.40–1.20)
GFR: 106.04 mL/min (ref >60.00)
Glucose, Bld: 114 mg/dL — ABNORMAL HIGH (ref 70–99)
Potassium: 3.5 mEq/L (ref 3.5–5.1)
Sodium: 135 mEq/L (ref 135–145)

## 2021-07-21 LAB — HEMOGLOBIN A1C: Hgb A1c MFr Bld: 7.7 % — ABNORMAL HIGH (ref 4.6–6.5)

## 2021-07-21 LAB — LIPID PANEL
Cholesterol: 183 mg/dL (ref 0–200)
HDL: 48.1 mg/dL (ref >39.00)
LDL Cholesterol: 109 mg/dL — ABNORMAL HIGH (ref 0–99)
NonHDL: 135.39
Total CHOL/HDL Ratio: 4
Triglycerides: 131 mg/dL (ref 0.0–149.0)
VLDL: 26.2 mg/dL (ref 0.0–40.0)

## 2021-07-21 LAB — MICROALBUMIN / CREATININE URINE RATIO
Creatinine,U: 10.3 mg/dL
Microalb Creat Ratio: 6.8 mg/g (ref 0.0–30.0)
Microalb, Ur: <0.7 mg/dL (ref 0.0–1.9)

## 2021-07-21 LAB — TSH: TSH: 2.52 u[IU]/mL (ref 0.35–5.50)

## 2021-07-21 MED ORDER — OMRON 3 SERIES BP MONITOR DEVI
0 refills | Status: DC
  Filled 2021-07-21: qty 1, 1d supply, fill #0

## 2021-07-22 ENCOUNTER — Other Ambulatory Visit (HOSPITAL_BASED_OUTPATIENT_CLINIC_OR_DEPARTMENT_OTHER): Payer: Self-pay

## 2021-07-22 ENCOUNTER — Other Ambulatory Visit: Payer: Self-pay | Admitting: Family

## 2021-07-22 DIAGNOSIS — E119 Type 2 diabetes mellitus without complications: Secondary | ICD-10-CM

## 2021-07-22 DIAGNOSIS — E282 Polycystic ovarian syndrome: Secondary | ICD-10-CM

## 2021-07-22 MED ORDER — METFORMIN HCL ER 750 MG PO TB24
750.0000 mg | ORAL_TABLET | Freq: Every day | ORAL | 1 refills | Status: DC
  Filled 2021-07-22: qty 90, 90d supply, fill #0
  Filled 2022-05-12: qty 90, 90d supply, fill #1

## 2021-08-12 ENCOUNTER — Other Ambulatory Visit (HOSPITAL_BASED_OUTPATIENT_CLINIC_OR_DEPARTMENT_OTHER): Payer: Self-pay

## 2021-08-12 ENCOUNTER — Other Ambulatory Visit: Payer: Self-pay | Admitting: Nurse Practitioner

## 2021-08-12 DIAGNOSIS — B001 Herpesviral vesicular dermatitis: Secondary | ICD-10-CM

## 2021-08-17 ENCOUNTER — Other Ambulatory Visit (HOSPITAL_BASED_OUTPATIENT_CLINIC_OR_DEPARTMENT_OTHER): Payer: Self-pay

## 2021-08-17 MED ORDER — VALACYCLOVIR HCL 500 MG PO TABS
ORAL_TABLET | Freq: Every day | ORAL | 3 refills | Status: AC
  Filled 2021-08-17: qty 90, 90d supply, fill #0
  Filled 2022-05-12: qty 90, 90d supply, fill #1

## 2021-08-17 NOTE — Telephone Encounter (Signed)
Chart supports Rx Last seen 07/05/21 Next OV 10/06/21

## 2021-08-18 ENCOUNTER — Other Ambulatory Visit (HOSPITAL_BASED_OUTPATIENT_CLINIC_OR_DEPARTMENT_OTHER): Payer: Self-pay

## 2021-10-06 ENCOUNTER — Ambulatory Visit (INDEPENDENT_AMBULATORY_CARE_PROVIDER_SITE_OTHER): Payer: 59 | Admitting: Nurse Practitioner

## 2021-10-06 ENCOUNTER — Other Ambulatory Visit: Payer: Self-pay

## 2021-10-06 ENCOUNTER — Encounter: Payer: Self-pay | Admitting: Nurse Practitioner

## 2021-10-06 VITALS — BP 138/90 | HR 108 | Temp 96.8°F | Ht 67.75 in | Wt 203.6 lb

## 2021-10-06 DIAGNOSIS — E119 Type 2 diabetes mellitus without complications: Secondary | ICD-10-CM | POA: Diagnosis not present

## 2021-10-06 DIAGNOSIS — Z1211 Encounter for screening for malignant neoplasm of colon: Secondary | ICD-10-CM | POA: Diagnosis not present

## 2021-10-06 DIAGNOSIS — K5904 Chronic idiopathic constipation: Secondary | ICD-10-CM | POA: Diagnosis not present

## 2021-10-06 DIAGNOSIS — Z0000 Encounter for general adult medical examination without abnormal findings: Secondary | ICD-10-CM | POA: Diagnosis not present

## 2021-10-06 NOTE — Progress Notes (Signed)
Complete physical exam  Patient: Cheryl Welch   DOB: 04/19/1976   46 y.o. Female  MRN: [id number] Visit Date: 10/06/2021  Subjective:    Chief Complaint  Patient presents with   Annual Exam   HPI  Cheryl Welch is a 46 y.o. female who presents today for a complete physical exam. She reports consuming a diabetic, low calorie diet. Gym/ health club routine includes cardio and mod to heavy weightlifting. She generally feels well. She reports sleeping well. She does not have additional problems to discuss today.  Vision:will schedule Dental:Within Last 6 months STD Screen:No Needs referral to GI for colon cancer screen  BP Readings from Last 3 Encounters:  10/06/21 138/90  07/05/21 (!) 140/98  05/13/21 (!) 162/109    Wt Readings from Last 3 Encounters:  10/06/21 203 lb 9.6 oz (92.4 kg)  07/05/21 202 lb (91.6 kg)  05/13/21 200 lb (90.7 kg)    Constipation Chronic, stable managed with use of prune juice. Fhx of colon polyps Entered referral for GI for colonoscopy  Elevated BP without diagnosis of hypertension Advised to monitor BP at home, maintain DASh diet. She is call office if BP>140/90  Past Medical History:  Diagnosis Date   Abnormal Pap smear    2004; LGSIL??>colpo>nml   Abnormal thyroid function test    Allergy    Arthritis    GERD (gastroesophageal reflux disease)    Hirsutism    HSV infection    Hypothyroidism 07/20/2015   PCO (polycystic ovaries)    Prediabetes    Past Surgical History:  Procedure Laterality Date   CESAREAN SECTION  07/24/1992   Social History   Socioeconomic History   Marital status: Married    Spouse name: Not on file   Number of children: 2   Years of education: Not on file   Highest education level: Not on file  Occupational History   Occupation: Producer, television/film/video: [employer]  Tobacco Use   Smoking status: Never   Smokeless tobacco: Never  Vaping Use   Vaping Use: Never used  Substance and Sexual Activity    Alcohol use: Yes    Comment: occassionally   Drug use: No   Sexual activity: Yes    Partners: Male    Birth control/protection: Pill  Other Topics Concern   Not on file  Social History Narrative   Not on file   Social Determinants of Health   Financial Resource Strain: Not on file  Food Insecurity: Not on file  Transportation Needs: Not on file  Physical Activity: Not on file  Stress: Not on file  Social Connections: Not on file  Intimate Partner Violence: Not on file   Family Status  Relation Name Status   Father father (Not Specified)   Mother parents (Not Specified)   MGM grandmother (Not Specified)   Neg Hx  (Not Specified)   Family History  Problem Relation Age of Onset   Cancer Father        kidney   Hypertension Father    Colon polyps Father    Other Father        esophageal stricture   Hypertension Mother    Diabetes Maternal Grandmother    Colon cancer Neg Hx    Esophageal cancer Neg Hx    Breast cancer Neg Hx    No Known Allergies  Patient Care Team: Michon Kaczmarek, Bonna Gains, NP as PCP - General (Internal Medicine)   Medications: Outpatient Medications Prior to  Visit  Medication Sig   Blood Pressure Monitoring (OMRON 3 SERIES BP MONITOR) DEVI Use as directed.   ibuprofen (ADVIL,MOTRIN) 800 MG tablet Take 1 tablet (800 mg total) by mouth every 8 (eight) hours as needed.   levonorgestrel-ethinyl estradiol (SEASONALE) 0.15-0.03 MG tablet TAKE 1 TABLET BY MOUTH DAILY.   metFORMIN (GLUCOPHAGE XR) 750 MG 24 hr tablet Take 1 tablet (750 mg total) by mouth daily with breakfast.   pantoprazole (PROTONIX) 40 MG tablet TAKE 1 TABLET (40 MG TOTAL) BY MOUTH DAILY.   spironolactone (ALDACTONE) 100 MG tablet TAKE 1 TABLET (100 MG TOTAL) BY MOUTH 2 (TWO) TIMES DAILY.   valACYclovir (VALTREX) 500 MG tablet TAKE 1 TABLET (500 MG TOTAL) BY MOUTH DAILY.   No facility-administered medications prior to visit.    Review of Systems  Constitutional:  Negative for activity  change, fatigue and fever.  HENT:  Negative for congestion and sore throat.   Eyes:        Negative for visual changes  Respiratory:  Negative for cough and shortness of breath.   Cardiovascular:  Negative for chest pain, palpitations and leg swelling.  Gastrointestinal:  Positive for constipation. Negative for blood in stool and diarrhea.  Endocrine: Negative.   Genitourinary:  Negative for dysuria, frequency and urgency.  Musculoskeletal:  Negative for myalgias.  Skin: Negative.  Negative for rash.  Neurological:  Negative for dizziness and headaches.  Hematological:  Does not bruise/bleed easily.  Psychiatric/Behavioral:  Negative for agitation, confusion, decreased concentration, dysphoric mood, self-injury and suicidal ideas. The patient is not nervous/anxious.        Objective:    BP 138/90 (BP Location: Left Arm, Patient Position: Sitting, Cuff Size: Large)   Pulse (!) 108   Temp (!) 96.8 F (36 C) (Temporal)   Ht 5' 7.75" (1.721 m)   Wt 203 lb 9.6 oz (92.4 kg)   SpO2 97%   BMI 31.19 kg/m    Physical Exam Vitals reviewed.  Constitutional:      General: She is not in acute distress.    Appearance: She is well-developed. She is obese.  HENT:     Right Ear: Tympanic membrane, ear canal and external ear normal.     Left Ear: Tympanic membrane, ear canal and external ear normal.  Eyes:     Extraocular Movements: Extraocular movements intact.     Conjunctiva/sclera: Conjunctivae normal.  Cardiovascular:     Rate and Rhythm: Normal rate and regular rhythm.     Pulses: Normal pulses.     Heart sounds: Normal heart sounds.  Pulmonary:     Effort: Pulmonary effort is normal. No respiratory distress.     Breath sounds: Normal breath sounds.  Chest:     Chest wall: No tenderness.  Abdominal:     General: Bowel sounds are normal.     Palpations: Abdomen is soft.  Genitourinary:    Comments: Breast and pelvic exam deferred to GYN Musculoskeletal:        General:  Normal range of motion.     Cervical back: Normal range of motion and neck supple.     Right lower leg: No edema.     Left lower leg: No edema.  Skin:    General: Skin is warm and dry.  Neurological:     Mental Status: She is alert and oriented to person, place, and time.     Deep Tendon Reflexes: Reflexes are normal and symmetric.  Psychiatric:  Mood and Affect: Mood normal.        Behavior: Behavior normal.        Thought Content: Thought content normal.    Depression Screen  PHQ 2/9 Scores 10/06/2021 07/05/2021 06/30/2020  PHQ - 2 Score 0 0 0  PHQ- 9 Score 0 0 -    Assessment & Plan:     Immunization History  Administered Date(s) Administered   Influenza,inj,Quad PF,6-35 Mos 04/30/2013   Influenza-Unspecified 04/25/2019, 05/05/2020, 05/08/2021   PFIZER(Purple Top)SARS-COV-2 Vaccination 10/03/2019, 10/24/2019   Tdap 03/27/2007   Health Maintenance  Topic Date Due   OPHTHALMOLOGY EXAM  Never done   TETANUS/TDAP  03/26/2017   COVID-19 Vaccine (3 - Pfizer risk series) 11/21/2019   COLONOSCOPY (Pts 45-59yrs Insurance coverage will need to be confirmed)  Never done   Hepatitis C Screening  07/05/2022 (Originally 04/05/1994)   HIV Screening  07/05/2022 (Originally 04/06/1991)   HEMOGLOBIN A1C  01/19/2022   URINE MICROALBUMIN  07/21/2022   FOOT EXAM  10/07/2022   PAP SMEAR-Modifier  11/02/2023   INFLUENZA VACCINE  Completed   HPV VACCINES  Aged Out   Discussed health benefits of physical activity, and encouraged her to engage in regular exercise appropriate for her age and condition.  Problem List Items Addressed This Visit       Endocrine   Controlled type 2 diabetes mellitus without complication, without long-term current use of insulin (HCC)   Relevant Orders   Hemoglobin A1c   Basic metabolic panel     Other   Constipation    Chronic, stable managed with use of prune juice. Fhx of colon polyps Entered referral for GI for colonoscopy      Elevated BP  without diagnosis of hypertension    Advised to monitor BP at home, maintain DASh diet. She is call office if BP>140/90      Other Visit Diagnoses     Preventative health care    -  Primary   Colon cancer screening       Relevant Orders   Ambulatory referral to Gastroenterology      Return in about 3 months (around 01/06/2022) for DM and elevated BP.     Alysia Penna, NP

## 2021-10-06 NOTE — Assessment & Plan Note (Signed)
Chronic, stable managed with use of prune juice. ?Fhx of colon polyps ?Entered referral for GI for colonoscopy ?

## 2021-10-06 NOTE — Assessment & Plan Note (Signed)
Advised to monitor BP at home, maintain DASh diet. ?She is call office if BP>140/90 ?

## 2021-10-06 NOTE — Patient Instructions (Addendum)
Have eye exam report sent to me ?Schedule appt with GI for colonoscopy. ? ?Monitor BP at home 2-3x/ week ?Call office if BP >140/90 ? ?Maintain heart healthy diet and regular exercise (133mns per week) ? ?Preventive Care [id number] Old, Female ?Preventive care refers to lifestyle choices and visits with your health care provider that can promote health and wellness. Preventive care visits are also called wellness exams. ?What can I expect for my preventive care visit? ?Counseling ?Your health care provider may ask you questions about your: ?Medical history, including: ?Past medical problems. ?Family medical history. ?Pregnancy history. ?Current health, including: ?Menstrual cycle. ?Method of birth control. ?Emotional well-being. ?Home life and relationship well-being. ?Sexual activity and sexual health. ?Lifestyle, including: ?Alcohol, nicotine or tobacco, and drug use. ?Access to firearms. ?Diet, exercise, and sleep habits. ?Work and work eStatistician ?Sunscreen use. ?Safety issues such as seatbelt and bike helmet use. ?Physical exam ?Your health care provider will check your: ?Height and weight. These may be used to calculate your BMI (body mass index). BMI is a measurement that tells if you are at a healthy weight. ?Waist circumference. This measures the distance around your waistline. This measurement also tells if you are at a healthy weight and may help predict your risk of certain diseases, such as type 2 diabetes and high blood pressure. ?Heart rate and blood pressure. ?Body temperature. ?Skin for abnormal spots. ?What immunizations do I need? ?Vaccines are usually given at various ages, according to a schedule. Your health care provider will recommend vaccines for you based on your age, medical history, and lifestyle or other factors, such as travel or where you work. ?What tests do I need? ?Screening ?Your health care provider may recommend screening tests for certain conditions. This may include: ?Lipid  and cholesterol levels. ?Diabetes screening. This is done by checking your blood sugar (glucose) after you have not eaten for a while (fasting). ?Pelvic exam and Pap test. ?Hepatitis B test. ?Hepatitis C test. ?HIV (human immunodeficiency virus) test. ?STI (sexually transmitted infection) testing, if you are at risk. ?Lung cancer screening. ?Colorectal cancer screening. ?Mammogram. Talk with your health care provider about when you should start having regular mammograms. This may depend on whether you have a family history of breast cancer. ?BRCA-related cancer screening. This may be done if you have a family history of breast, ovarian, tubal, or peritoneal cancers. ?Bone density scan. This is done to screen for osteoporosis. ?Talk with your health care provider about your test results, treatment options, and if necessary, the need for more tests. ?Follow these instructions at home: ?Eating and drinking ? ?Eat a diet that includes fresh fruits and vegetables, whole grains, lean protein, and low-fat dairy products. ?Take vitamin and mineral supplements as recommended by your health care provider. ?Do not drink alcohol if: ?Your health care provider tells you not to drink. ?You are pregnant, may be pregnant, or are planning to become pregnant. ?If you drink alcohol: ?Limit how much you have to 0-1 drink a day. ?Know how much alcohol is in your drink. In the [place] one drink equals one 12 oz bottle of beer (355 mL), one 5 oz glass of wine (148 mL), or one 1? oz glass of hard liquor (44 mL). ?Lifestyle ?Brush your teeth every morning and night with fluoride toothpaste. Floss one time each day. ?Exercise for at least 30 minutes 5 or more days each week. ?Do not use any products that contain nicotine or tobacco. These products include cigarettes, chewing tobacco, and  vaping devices, such as e-cigarettes. If you need help quitting, ask your health care provider. ?Do not use drugs. ?If you are sexually active, practice safe  sex. Use a condom or other form of protection to prevent STIs. ?If you do not wish to become pregnant, use a form of birth control. If you plan to become pregnant, see your health care provider for a prepregnancy visit. ?Take aspirin only as told by your health care provider. Make sure that you understand how much to take and what form to take. Work with your health care provider to find out whether it is safe and beneficial for you to take aspirin daily. ?Find healthy ways to manage stress, such as: ?Meditation, yoga, or listening to music. ?Journaling. ?Talking to a trusted person. ?Spending time with friends and family. ?Minimize exposure to UV radiation to reduce your risk of skin cancer. ?Safety ?Always wear your seat belt while driving or riding in a vehicle. ?Do not drive: ?If you have been drinking alcohol. Do not ride with someone who has been drinking. ?When you are tired or distracted. ?While texting. ?If you have been using any mind-altering substances or drugs. ?Wear a helmet and other protective equipment during sports activities. ?If you have firearms in your house, make sure you follow all gun safety procedures. ?Seek help if you have been physically or sexually abused. ?What's next? ?Visit your health care provider once a year for an annual wellness visit. ?Ask your health care provider how often you should have your eyes and teeth checked. ?Stay up to date on all vaccines. ?This information is not intended to replace advice given to you by your health care provider. Make sure you discuss any questions you have with your health care provider. ?Document Revised: 01/05/2021 Document Reviewed: 01/05/2021 ?Elsevier Patient Education ? [place]. ? ?

## 2021-10-21 ENCOUNTER — Encounter: Payer: Self-pay | Admitting: Gastroenterology

## 2021-11-14 ENCOUNTER — Other Ambulatory Visit: Payer: Self-pay | Admitting: Nurse Practitioner

## 2021-11-14 ENCOUNTER — Other Ambulatory Visit (HOSPITAL_BASED_OUTPATIENT_CLINIC_OR_DEPARTMENT_OTHER): Payer: Self-pay

## 2021-11-14 DIAGNOSIS — E282 Polycystic ovarian syndrome: Secondary | ICD-10-CM

## 2021-11-15 ENCOUNTER — Other Ambulatory Visit (HOSPITAL_BASED_OUTPATIENT_CLINIC_OR_DEPARTMENT_OTHER): Payer: Self-pay

## 2021-11-25 ENCOUNTER — Other Ambulatory Visit (HOSPITAL_BASED_OUTPATIENT_CLINIC_OR_DEPARTMENT_OTHER): Payer: Self-pay

## 2021-11-25 ENCOUNTER — Other Ambulatory Visit: Payer: Self-pay | Admitting: Nurse Practitioner

## 2021-11-25 DIAGNOSIS — E282 Polycystic ovarian syndrome: Secondary | ICD-10-CM

## 2021-11-29 ENCOUNTER — Other Ambulatory Visit (HOSPITAL_BASED_OUTPATIENT_CLINIC_OR_DEPARTMENT_OTHER): Payer: Self-pay

## 2021-11-29 NOTE — Telephone Encounter (Signed)
Pt states Metformin 750 mg has been sent in for her but she never took it and has been continuously taking the Metformin 500 mg and would like to know if she could get a refill or does she have to take the 750? ?

## 2021-11-29 NOTE — Telephone Encounter (Signed)
Pt states she is going on a cruise on Friday and she does not have time to go to [facility] to have blood drawn, I asked patient will she be out of medication and she states yes but she thinks she has some of the 750 mg and she will just take that until she comes back from her 2 week cruise. Pt states if she does not have any of the 740 mg she will just wait until she comes back in two weeks.  ?

## 2021-12-30 ENCOUNTER — Encounter: Payer: 59 | Admitting: Gastroenterology

## 2022-02-07 ENCOUNTER — Other Ambulatory Visit (HOSPITAL_BASED_OUTPATIENT_CLINIC_OR_DEPARTMENT_OTHER): Payer: Self-pay

## 2022-02-13 ENCOUNTER — Other Ambulatory Visit (HOSPITAL_BASED_OUTPATIENT_CLINIC_OR_DEPARTMENT_OTHER): Payer: Self-pay

## 2022-05-09 ENCOUNTER — Other Ambulatory Visit: Payer: Self-pay | Admitting: Nurse Practitioner

## 2022-05-09 ENCOUNTER — Other Ambulatory Visit (HOSPITAL_BASED_OUTPATIENT_CLINIC_OR_DEPARTMENT_OTHER): Payer: Self-pay

## 2022-05-09 DIAGNOSIS — Z1231 Encounter for screening mammogram for malignant neoplasm of breast: Secondary | ICD-10-CM

## 2022-05-12 ENCOUNTER — Ambulatory Visit
Admission: RE | Admit: 2022-05-12 | Discharge: 2022-05-12 | Disposition: A | Discharge status: Home / Self Care | Payer: 59 | Source: Ambulatory Visit

## 2022-05-12 ENCOUNTER — Other Ambulatory Visit (INDEPENDENT_AMBULATORY_CARE_PROVIDER_SITE_OTHER): Payer: 59

## 2022-05-12 ENCOUNTER — Other Ambulatory Visit (HOSPITAL_BASED_OUTPATIENT_CLINIC_OR_DEPARTMENT_OTHER): Payer: Self-pay

## 2022-05-12 DIAGNOSIS — E119 Type 2 diabetes mellitus without complications: Secondary | ICD-10-CM

## 2022-05-12 DIAGNOSIS — Z1231 Encounter for screening mammogram for malignant neoplasm of breast: Secondary | ICD-10-CM

## 2022-05-12 LAB — BASIC METABOLIC PANEL
BUN: 6 mg/dL (ref 6–23)
CO2: 25 mEq/L (ref 19–32)
Calcium: 9.1 mg/dL (ref 8.4–10.5)
Chloride: 99 mEq/L (ref 96–112)
Creatinine, Ser: 0.71 mg/dL (ref 0.40–1.20)
GFR: 102.2 mL/min (ref >60.00)
Glucose, Bld: 134 mg/dL — ABNORMAL HIGH (ref 70–99)
Potassium: 4.2 mEq/L (ref 3.5–5.1)
Sodium: 137 mEq/L (ref 135–145)

## 2022-05-12 LAB — HEMOGLOBIN A1C: Hgb A1c MFr Bld: 8.2 % — ABNORMAL HIGH (ref 4.6–6.5)

## 2022-06-23 LAB — HM DIABETES EYE EXAM

## 2022-06-26 ENCOUNTER — Other Ambulatory Visit (HOSPITAL_BASED_OUTPATIENT_CLINIC_OR_DEPARTMENT_OTHER): Payer: Self-pay

## 2022-06-26 ENCOUNTER — Other Ambulatory Visit: Payer: Self-pay | Admitting: Nurse Practitioner

## 2022-06-26 DIAGNOSIS — E282 Polycystic ovarian syndrome: Secondary | ICD-10-CM

## 2022-06-26 MED ORDER — SPIRONOLACTONE 100 MG PO TABS
ORAL_TABLET | Freq: Two times a day (BID) | ORAL | 0 refills | Status: DC
  Filled 2022-06-26: qty 60, 30d supply, fill #0

## 2022-06-26 NOTE — Telephone Encounter (Signed)
Chart supports Rx Last OV: 09/2021 Next OV: not scheduled. Pt needs f/u appt for further refills

## 2022-07-21 ENCOUNTER — Other Ambulatory Visit: Payer: Self-pay

## 2022-07-21 ENCOUNTER — Encounter (HOSPITAL_BASED_OUTPATIENT_CLINIC_OR_DEPARTMENT_OTHER): Payer: Self-pay | Admitting: Emergency Medicine

## 2022-07-21 DIAGNOSIS — R200 Anesthesia of skin: Secondary | ICD-10-CM | POA: Insufficient documentation

## 2022-07-21 DIAGNOSIS — E039 Hypothyroidism, unspecified: Secondary | ICD-10-CM | POA: Diagnosis not present

## 2022-07-21 DIAGNOSIS — M79602 Pain in left arm: Secondary | ICD-10-CM | POA: Diagnosis present

## 2022-07-21 NOTE — ED Triage Notes (Signed)
Left arm pain started 2 weeks ago,  middle finger and pointer finger numbness started 10 days ago Denies injury, used computer at work

## 2022-07-22 ENCOUNTER — Emergency Department (HOSPITAL_BASED_OUTPATIENT_CLINIC_OR_DEPARTMENT_OTHER)
Admission: EM | Admit: 2022-07-22 | Discharge: 2022-07-22 | Disposition: A | Discharge status: Home / Self Care | Payer: 59 | Attending: Emergency Medicine | Admitting: Emergency Medicine

## 2022-07-22 DIAGNOSIS — M792 Neuralgia and neuritis, unspecified: Secondary | ICD-10-CM

## 2022-07-22 HISTORY — DX: Type 2 diabetes mellitus without complications: E11.9

## 2022-07-22 MED ORDER — KETOROLAC TROMETHAMINE 30 MG/ML IJ SOLN
30.0000 mg | Freq: Once | INTRAMUSCULAR | Status: AC
  Administered 2022-07-22: 30 mg via INTRAMUSCULAR
  Filled 2022-07-22: qty 1

## 2022-07-22 MED ORDER — METHOCARBAMOL 500 MG PO TABS
500.0000 mg | ORAL_TABLET | Freq: Once | ORAL | Status: AC
  Administered 2022-07-22: 500 mg via ORAL
  Filled 2022-07-22: qty 1

## 2022-07-22 MED ORDER — METHYLPREDNISOLONE 4 MG PO TBPK: ORAL_TABLET | ORAL | 0 refills | Status: DC

## 2022-07-22 MED ORDER — METHOCARBAMOL 500 MG PO TABS: 500.0000 mg | ORAL_TABLET | Freq: Two times a day (BID) | ORAL | 0 refills | Status: DC

## 2022-07-22 NOTE — Discharge Instructions (Signed)
You were seen today for arm pain and numbness in the fingers.  This is most consistent with radicular symptoms.  Take medications as prescribed.  If pain and numbness persists after medications have finished, you need to follow-up with your primary doctor.  You may need some advanced imaging including MRI.

## 2022-07-22 NOTE — ED Provider Notes (Signed)
[facility] DEPT Provider Note   CSN: [id number] Arrival date & time: 07/21/22  2111     History  Chief Complaint  Patient presents with   Arm Pain    Cheryl Welch is a 46 y.o. female.  HPI     This is a 46 year old female who presents with left arm pain and numbness in the second and third digits.  Patient reports symptoms for the last 2 weeks.  She states that she has had some pain in the left shoulder that has radiated down her arm.  Since that time she has noted numbness in the tips of her second and third digit.  She denies any injury.  She does not know whether she slept on her arm wrong.  Denies any weakness.  She has not really taken anything for her symptoms.  She states that the numbness and pain changes in character when she moves her left shoulder.  Home Medications Prior to Admission medications   Medication Sig Start Date End Date Taking? Authorizing Provider  methocarbamol (ROBAXIN) 500 MG tablet Take 1 tablet (500 mg total) by mouth 2 (two) times daily. 07/22/22  Yes Thane Age, Barbette Hair, MD  methylPREDNISolone (MEDROL DOSEPAK) 4 MG TBPK tablet Take as directed on packet 07/22/22  Yes Tishanna Dunford, Barbette Hair, MD  Blood Pressure Monitoring (OMRON 3 SERIES BP MONITOR) DEVI Use as directed. 07/21/21   Clementeen Graham, RPH  ibuprofen (ADVIL,MOTRIN) 800 MG tablet Take 1 tablet (800 mg total) by mouth every 8 (eight) hours as needed. 08/23/18   Cirigliano, Garvin Fila, DO  levonorgestrel-ethinyl estradiol (SEASONALE) 0.15-0.03 MG tablet TAKE 1 TABLET BY MOUTH DAILY. 07/05/21 08/18/22  Nche, Charlene Brooke, NP  metFORMIN (GLUCOPHAGE-XR) 750 MG 24 hr tablet Take 1 tablet (750 mg total) by mouth daily with breakfast. 07/22/21   Dutch Quint B, FNP  pantoprazole (PROTONIX) 40 MG tablet TAKE 1 TABLET (40 MG TOTAL) BY MOUTH DAILY. 07/05/21 08/13/22  Nche, Charlene Brooke, NP  spironolactone (ALDACTONE) 100 MG tablet TAKE 1 TABLET (100 MG TOTAL) BY MOUTH 2 (TWO) TIMES  DAILY. PT NEED APPT FOR FUTURE REFILLS 06/26/22 06/26/23  Nche, Charlene Brooke, NP  valACYclovir (VALTREX) 500 MG tablet TAKE 1 TABLET (500 MG TOTAL) BY MOUTH DAILY. 08/17/21 08/17/22  Nche, Charlene Brooke, NP      Allergies    Patient has no known allergies.    Review of Systems   Review of Systems  Constitutional:  Negative for fever.  Neurological:  Positive for numbness.  All other systems reviewed and are negative.   Physical Exam Updated Vital Signs BP (!) 173/94   Pulse 98   Temp 98.4 F (36.9 C) (Oral)   Resp 18   SpO2 98%  Physical Exam Vitals and nursing note reviewed.  Constitutional:      Appearance: She is well-developed. She is obese. She is not ill-appearing.  HENT:     Head: Normocephalic and atraumatic.  Eyes:     Pupils: Pupils are equal, round, and reactive to light.  Cardiovascular:     Rate and Rhythm: Normal rate and regular rhythm.     Heart sounds: Normal heart sounds.  Pulmonary:     Effort: Pulmonary effort is normal. No respiratory distress.     Breath sounds: No wheezing.  Abdominal:     General: Bowel sounds are normal.     Palpations: Abdomen is soft.  Musculoskeletal:     Cervical back: Neck supple.     Comments: Tenderness  palpation left paraspinous musculature and left brachial plexus region, patient endorses reproduction pain with palpation of these regions and pain radiating down the left arm  Skin:    General: Skin is warm and dry.  Neurological:     Mental Status: She is alert and oriented to person, place, and time.     Comments: 5 out of 5 biceps, triceps, deltoid, grip strength bilaterally  Psychiatric:        Mood and Affect: Mood normal.     ED Results / Procedures / Treatments   Labs (all labs ordered are listed, but only abnormal results are displayed) Labs Reviewed - No data to display  EKG None  Radiology No results found.  Procedures Procedures    Medications Ordered in ED Medications  ketorolac (TORADOL) 30  MG/ML injection 30 mg (30 mg Intramuscular Given 07/22/22 0250)  methocarbamol (ROBAXIN) tablet 500 mg (500 mg Oral Given 07/22/22 0250)    ED Course/ Medical Decision Making/ A&P                           Medical Decision Making Risk Prescription drug management.   This patient presents to the ED for concern of numbness, left arm pain., this involves an extensive number of treatment options, and is a complaint that carries with it a high risk of complications and morbidity.  I considered the following differential and admission for this acute, potentially life threatening condition.  The differential diagnosis includes radicular pain, nerve impingement of the neck or shoulder, Saturday night palsy  MDM:    This is a 46 year old female who presents with left arm pain and numbness.  She is nontoxic and vital signs are reassuring with the exception of blood pressure 173/94.  Patient has symptoms most suggestive of radicular pathology.  She does not have any objective weakness.  She has numbness in a partial median nerve distribution although it seems to originate beyond the wrist and more in the shoulder.  Do not feel she needs advanced imaging at this time.  Will trial on a Medrol Dosepak for inflammatory radiculopathy and have her follow-up close with her primary physician.  Did discuss that she may need further imaging if not improving.  (Labs, imaging, consults)  Labs: I Ordered, and personally interpreted labs.  The pertinent results include: None  Imaging Studies ordered: I ordered imaging studies including none I independently visualized and interpreted imaging. I agree with the radiologist interpretation  Additional history obtained from family at bedside.  External records from outside source obtained and reviewed including prior evaluations  Cardiac Monitoring: The patient was maintained on a cardiac monitor.  I personally viewed and interpreted the cardiac monitored which  showed an underlying rhythm of: Sinus rhythm  Reevaluation: After the interventions noted above, I reevaluated the patient and found that they have :stayed the same  Social Determinants of Health:  lives independently  Disposition: Discharge  Co morbidities that complicate the patient evaluation  Past Medical History:  Diagnosis Date   Abnormal Pap smear    2004; LGSIL??>colpo>nml   Abnormal thyroid function test    Allergy    Arthritis    Diabetes mellitus without complication ([place])    GERD (gastroesophageal reflux disease)    Herpes simplex 09/03/2015   Hirsutism    HSV infection    Hypothyroidism 07/20/2015   PCO (polycystic ovaries)    Prediabetes      Medicines Meds ordered this encounter  Medications   ketorolac (TORADOL) 30 MG/ML injection 30 mg   methocarbamol (ROBAXIN) tablet 500 mg   methylPREDNISolone (MEDROL DOSEPAK) 4 MG TBPK tablet    Sig: Take as directed on packet    Dispense:  21 tablet    Refill:  0   methocarbamol (ROBAXIN) 500 MG tablet    Sig: Take 1 tablet (500 mg total) by mouth 2 (two) times daily.    Dispense:  20 tablet    Refill:  0    I have reviewed the patients home medicines and have made adjustments as needed  Problem List / ED Course: Problem List Items Addressed This Visit   None Visit Diagnoses     Radicular pain in left arm    -  Primary                   Final Clinical Impression(s) / ED Diagnoses Final diagnoses:  Radicular pain in left arm    Rx / DC Orders ED Discharge Orders          Ordered    methylPREDNISolone (MEDROL DOSEPAK) 4 MG TBPK tablet        07/22/22 0302    methocarbamol (ROBAXIN) 500 MG tablet  2 times daily        07/22/22 0302              Damiano Stamper, Barbette Hair, MD 07/22/22 [phone]

## 2022-08-01 ENCOUNTER — Ambulatory Visit (INDEPENDENT_AMBULATORY_CARE_PROVIDER_SITE_OTHER): Payer: 59 | Admitting: Nurse Practitioner

## 2022-08-01 ENCOUNTER — Other Ambulatory Visit (HOSPITAL_BASED_OUTPATIENT_CLINIC_OR_DEPARTMENT_OTHER): Payer: Self-pay

## 2022-08-01 ENCOUNTER — Encounter: Payer: Self-pay | Admitting: Nurse Practitioner

## 2022-08-01 VITALS — BP 148/90 | HR 96 | Temp 98.6°F | Ht 67.0 in | Wt 202.8 lb

## 2022-08-01 DIAGNOSIS — I10 Essential (primary) hypertension: Secondary | ICD-10-CM

## 2022-08-01 DIAGNOSIS — E282 Polycystic ovarian syndrome: Secondary | ICD-10-CM

## 2022-08-01 DIAGNOSIS — E1165 Type 2 diabetes mellitus with hyperglycemia: Secondary | ICD-10-CM | POA: Diagnosis not present

## 2022-08-01 DIAGNOSIS — M79602 Pain in left arm: Secondary | ICD-10-CM | POA: Insufficient documentation

## 2022-08-01 DIAGNOSIS — Z3040 Encounter for surveillance of contraceptives, unspecified: Secondary | ICD-10-CM

## 2022-08-01 DIAGNOSIS — M19011 Primary osteoarthritis, right shoulder: Secondary | ICD-10-CM | POA: Insufficient documentation

## 2022-08-01 DIAGNOSIS — M7542 Impingement syndrome of left shoulder: Secondary | ICD-10-CM | POA: Diagnosis not present

## 2022-08-01 HISTORY — DX: Encounter for surveillance of contraceptives, unspecified: Z30.40

## 2022-08-01 MED ORDER — CYCLOBENZAPRINE HCL 5 MG PO TABS
5.0000 mg | ORAL_TABLET | Freq: Two times a day (BID) | ORAL | 0 refills | Status: DC
  Filled 2022-08-01: qty 30, 15d supply, fill #0

## 2022-08-01 MED ORDER — FREESTYLE LITE TEST VI STRP
ORAL_STRIP | 0 refills | Status: DC
  Filled 2022-08-01 (×2): qty 100, 90d supply, fill #0

## 2022-08-01 MED ORDER — AMLODIPINE BESYLATE 5 MG PO TABS
5.0000 mg | ORAL_TABLET | Freq: Every day | ORAL | 0 refills | Status: DC
  Filled 2022-08-01: qty 90, 90d supply, fill #0

## 2022-08-01 MED ORDER — IBUPROFEN 800 MG PO TABS
800.0000 mg | ORAL_TABLET | Freq: Three times a day (TID) | ORAL | 0 refills | Status: DC | PRN
  Filled 2022-08-01: qty 30, 10d supply, fill #0

## 2022-08-01 MED ORDER — METFORMIN HCL ER 750 MG PO TB24
750.0000 mg | ORAL_TABLET | Freq: Every day | ORAL | 0 refills | Status: DC
  Filled 2022-08-01: qty 90, 90d supply, fill #0

## 2022-08-01 MED ORDER — BLOOD GLUCOSE METER KIT
PACK | 0 refills | Status: DC
  Filled 2022-08-01: qty 1, 30d supply, fill #0

## 2022-08-01 MED ORDER — FREESTYLE LANCETS MISC
0 refills | Status: DC
  Filled 2022-08-01: qty 100, 90d supply, fill #0

## 2022-08-01 MED ORDER — LEVONORGEST-ETH ESTRAD 91-DAY 0.15-0.03 MG PO TABS
1.0000 | ORAL_TABLET | Freq: Every day | ORAL | 1 refills | Status: DC
  Filled 2022-08-01: qty 91, 91d supply, fill #0

## 2022-08-01 NOTE — Patient Instructions (Signed)
Use ibuprofen and flexeril for arm pain Start shoulder exercise Resume metformin daily Start amlodipine for hypertension Monitor BP and glucose every other day in AM. Bring reading to next appt. Maintain low salt/low carb/low sugar diet  Shoulder Impingement Syndrome Rehab Ask your health care provider which exercises are safe for you. Do exercises exactly as told by your health care provider and adjust them as directed. It is normal to feel mild stretching, pulling, tightness, or discomfort as you do these exercises. Stop right away if you feel sudden pain or your pain gets worse. Do not begin these exercises until told by your health care provider. Stretching and range-of-motion exercise This exercise warms up your muscles and joints and improves the movement and flexibility of your shoulder. This exercise also helps to relieve pain and stiffness. Passive horizontal adduction In passive adduction, you use your other hand to move the injured arm toward your body. The injured arm does not move on its own. In this movement, your arm is moved across your body in the horizontal plane (horizontal adduction). Sit or stand and pull your left / right elbow across your chest, toward your other shoulder. Stop when you feel a gentle stretch in the back of your shoulder and upper arm. Keep your arm at shoulder height. Keep your arm as close to your body as you comfortably can. Hold for ______10____ seconds. Slowly return to the starting position. Repeat _____10_____ times. Complete this exercise ______1____ times a day. Strengthening exercises These exercises build strength and endurance in your shoulder. Endurance is the ability to use your muscles for a long time, even after they get tired. External rotation, isometric This is an exercise in which you press the back of your wrist against a door frame without moving your shoulder joint (isometric). Stand or sit in a doorway, facing the door  frame. Bend your left / right elbow and place the back of your wrist against the door frame. Only the back of your wrist should be touching the frame. Keep your upper arm at your side. Gently press your wrist against the door frame, as if you are trying to push your arm away from your abdomen (external rotation). Press as hard as you are able without pain. Avoid shrugging your shoulder while you press your wrist against the door frame. Keep your shoulder blade tucked down toward the middle of your back. Hold for __________ seconds. Slowly release the tension, and relax your muscles completely before you repeat the exercise. Repeat __________ times. Complete this exercise __________ times a day. Internal rotation, isometric This is an exercise in which you press your palm against a door frame without moving your shoulder joint (isometric). Stand or sit in a doorway, facing the door frame. Bend your left / right elbow and place the palm of your hand against the door frame. Only your palm should be touching the frame. Keep your upper arm at your side. Gently press your hand against the door frame, as if you are trying to push your arm toward your abdomen (internal rotation). Press as hard as you are able without pain. Avoid shrugging your shoulder while you press your hand against the door frame. Keep your shoulder blade tucked down toward the middle of your back. Hold for __________ seconds. Slowly release the tension, and relax your muscles completely before you repeat the exercise. Repeat __________ times. Complete this exercise __________ times a day. Scapular protraction, supine  Lie on your back on a firm surface (  supine position). Hold a __________ weight in your left / right hand. Raise your left / right arm straight into the air so your hand is directly above your shoulder joint. Push the weight into the air so your shoulder (scapula) lifts off the surface that you are lying on. The scapula  will push up or forward (protraction). Do not move your head, neck, or back. Hold for __________ seconds. Slowly return to the starting position. Let your muscles relax completely before you repeat this exercise. Repeat __________ times. Complete this exercise __________ times a day. Scapular retraction  Sit in a stable chair without armrests, or stand up. Secure an exercise band to a stable object in front of you so the band is at shoulder height. Hold one end of the exercise band in each hand. Your palms should face down. Squeeze your shoulder blades together (retraction) and move your elbows slightly behind you. Do not shrug your shoulders upward while you do this. Hold for __________ seconds. Slowly return to the starting position. Repeat __________ times. Complete this exercise __________ times a day. Shoulder extension  Sit in a stable chair without armrests, or stand up. Secure an exercise band to a stable object in front of you so the band is above shoulder height. Hold one end of the exercise band in each hand. Straighten your elbows and lift your hands up to shoulder height. Squeeze your shoulder blades together and pull your hands down to the sides of your thighs (extension). Stop when your hands are straight down by your sides. Do not let your hands go behind your body. Hold for __________ seconds. Slowly return to the starting position. Repeat __________ times. Complete this exercise __________ times a day. This information is not intended to replace advice given to you by your health care provider. Make sure you discuss any questions you have with your health care provider. Document Revised: 11/01/2018 Document Reviewed: 08/05/2018 Elsevier Patient Education  [place].

## 2022-08-01 NOTE — Assessment & Plan Note (Addendum)
Uncontrolled. Inconsistent to metformin dose. lasthgbA1c at 8.2%  Advised about importance of med and diet compliance. Sent metformin '750mg'$  refills Provided glucometer, monitor glucose at home and bring readings F/up in 16month

## 2022-08-01 NOTE — Progress Notes (Signed)
Established Patient Visit  Patient: Cheryl Welch   DOB: 05/25/1976   47 y.o. Female  MRN: [id number] Visit Date: 08/01/2022  Subjective:    Chief Complaint  Patient presents with   Acute Visit    Left 1st and 2nd digits. Left arm tingling. Edema to left arm.   HPI HTN (hypertension) Uncontrolled BP with spironolactone. BP Readings from Last 3 Encounters:  08/01/22 (!) 148/90  07/22/22 (!) 173/94  10/06/21 138/90    Start amlodipine Advised about importance of DASH diet and exercise Monitor BP at home and bring reading F/up in 2weeks  DM (diabetes mellitus) ([place]) Uncontrolled. Inconsistent to metformin dose. lasthgbA1c at 8.2%  Advised about importance of med and diet compliance. Sent metformin '750mg'$  refills Provided glucometer, monitor glucose at home and bring readings F/up in 52month  Encounter for refill of prescription for contraception COC refill sent Advised about possible complications of uncontrolled BP with use of COC. F/up in 2weeks  Shoulder impingement syndrome, left Paresthesia to 1st and 2nd fingers, no weakness, no neck pain, worse with exetrnal rotation of right shoulder. Associated with posterior shoulder tightness and arch, no limitation of neck and shoulder ROM. Minimal improvement with low dose ibuprofen and medrol dose pack. Currently works on the computer for about 10hrs daily  Sent ibuprofen '800mg'$  every 8hrs and flexeril '5mg'$  BID. Advised about risk of sedation with flexeril. Provided shoulder Rom exercises F/up in 2weeks.  Reviewed medical, surgical, and social history today  Medications: Outpatient Medications Prior to Visit  Medication Sig   Blood Pressure Monitoring (OMRON 3 SERIES BP MONITOR) DEVI Use as directed.   pantoprazole (PROTONIX) 40 MG tablet TAKE 1 TABLET (40 MG TOTAL) BY MOUTH DAILY.   spironolactone (ALDACTONE) 100 MG tablet TAKE 1 TABLET (100 MG TOTAL) BY MOUTH 2 (TWO) TIMES DAILY. PT NEED APPT FOR FUTURE  REFILLS   valACYclovir (VALTREX) 500 MG tablet TAKE 1 TABLET (500 MG TOTAL) BY MOUTH DAILY.   [DISCONTINUED] ibuprofen (ADVIL,MOTRIN) 800 MG tablet Take 1 tablet (800 mg total) by mouth every 8 (eight) hours as needed.   [DISCONTINUED] levonorgestrel-ethinyl estradiol (SEASONALE) 0.15-0.03 MG tablet TAKE 1 TABLET BY MOUTH DAILY.   [DISCONTINUED] metFORMIN (GLUCOPHAGE-XR) 750 MG 24 hr tablet Take 1 tablet (750 mg total) by mouth daily with breakfast.   [DISCONTINUED] methocarbamol (ROBAXIN) 500 MG tablet Take 1 tablet (500 mg total) by mouth 2 (two) times daily.   [DISCONTINUED] methylPREDNISolone (MEDROL DOSEPAK) 4 MG TBPK tablet Take as directed on packet (Patient not taking: Reported on 08/01/2022)   No facility-administered medications prior to visit.   Reviewed past medical and social history.   ROS per HPI above      Objective:  BP (!) 148/90   Pulse 96   Temp 98.6 F (37 C) (Oral)   Ht '5\' 7"'$  (1.702 m)   Wt 202 lb 12.8 oz (92 kg)   LMP  (Exact Date)   SpO2 97%   BMI 31.76 kg/m      Physical Exam Vitals reviewed.  Cardiovascular:     Rate and Rhythm: Normal rate and regular rhythm.     Pulses: Normal pulses.     Heart sounds: Normal heart sounds.  Pulmonary:     Effort: Pulmonary effort is normal.     Breath sounds: Normal breath sounds.  Musculoskeletal:        General: Tenderness present. No swelling.  Left shoulder: Tenderness present. No swelling, deformity, effusion, laceration, bony tenderness or crepitus. Normal range of motion. Normal strength. Normal pulse.     Left upper arm: Normal.     Left elbow: Normal.     Left forearm: Normal.     Left wrist: Normal.     Left hand: Normal.     Cervical back: Normal.     Thoracic back: Normal.     Right lower leg: No edema.     Left lower leg: No edema.  Neurological:     Mental Status: She is alert and oriented to person, place, and time.     No results found for any visits on 08/01/22.    Assessment &  Plan:    Problem List Items Addressed This Visit       Cardiovascular and Mediastinum   HTN (hypertension) - Primary    Uncontrolled BP with spironolactone. BP Readings from Last 3 Encounters:  08/01/22 (!) 148/90  07/22/22 (!) 173/94  10/06/21 138/90    Start amlodipine Advised about importance of DASH diet and exercise Monitor BP at home and bring reading F/up in 2weeks      Relevant Medications   amLODipine (NORVASC) 5 MG tablet     Endocrine   DM (diabetes mellitus) ([place])    Uncontrolled. Inconsistent to metformin dose. lasthgbA1c at 8.2%  Advised about importance of med and diet compliance. Sent metformin '750mg'$  refills Provided glucometer, monitor glucose at home and bring readings F/up in 40month      Relevant Medications   metFORMIN (GLUCOPHAGE-XR) 750 MG 24 hr tablet   blood glucose meter kit and supplies     Musculoskeletal and Integument   Shoulder impingement syndrome, left    Paresthesia to 1st and 2nd fingers, no weakness, no neck pain, worse with exetrnal rotation of right shoulder. Associated with posterior shoulder tightness and arch, no limitation of neck and shoulder ROM. Minimal improvement with low dose ibuprofen and medrol dose pack. Currently works on the computer for about 10hrs daily  Sent ibuprofen '800mg'$  every 8hrs and flexeril '5mg'$  BID. Advised about risk of sedation with flexeril. Provided shoulder Rom exercises F/up in 2weeks.      Relevant Medications   ibuprofen (ADVIL) 800 MG tablet   cyclobenzaprine (FLEXERIL) 5 MG tablet     Other   Encounter for refill of prescription for contraception    COC refill sent Advised about possible complications of uncontrolled BP with use of COC. F/up in 2weeks      Relevant Medications   levonorgestrel-ethinyl estradiol (SEASONALE) 0.15-0.03 MG tablet   Other Visit Diagnoses     PCOS (polycystic ovarian syndrome)       Relevant Medications   metFORMIN (GLUCOPHAGE-XR) 750 MG 24 hr tablet       Return in about 2 weeks (around 08/15/2022) for HTN, DM, shoulder pain.     CWilfred Lacy NP

## 2022-08-01 NOTE — Assessment & Plan Note (Addendum)
Paresthesia to 1st and 2nd fingers, no weakness, no neck pain, worse with exetrnal rotation of right shoulder. Associated with posterior shoulder tightness and arch, no limitation of neck and shoulder ROM. Minimal improvement with low dose ibuprofen and medrol dose pack. Currently works on the computer for about 10hrs daily  Sent ibuprofen '800mg'$  every 8hrs and flexeril '5mg'$  BID. Advised about risk of sedation with flexeril. Provided shoulder Rom exercises F/up in 2weeks.

## 2022-08-01 NOTE — Assessment & Plan Note (Addendum)
Uncontrolled BP with spironolactone. BP Readings from Last 3 Encounters:  08/01/22 (!) 148/90  07/22/22 (!) 173/94  10/06/21 138/90    Start amlodipine Advised about importance of DASH diet and exercise Monitor BP at home and bring reading F/up in 2weeks

## 2022-08-01 NOTE — Assessment & Plan Note (Signed)
COC refill sent Advised about possible complications of uncontrolled BP with use of COC. F/up in 2weeks

## 2022-08-09 ENCOUNTER — Other Ambulatory Visit (HOSPITAL_BASED_OUTPATIENT_CLINIC_OR_DEPARTMENT_OTHER): Payer: Self-pay

## 2022-08-17 ENCOUNTER — Other Ambulatory Visit (HOSPITAL_BASED_OUTPATIENT_CLINIC_OR_DEPARTMENT_OTHER): Payer: Self-pay

## 2022-08-17 ENCOUNTER — Encounter: Payer: Self-pay | Admitting: Nurse Practitioner

## 2022-08-17 ENCOUNTER — Ambulatory Visit: Payer: 59 | Admitting: Nurse Practitioner

## 2022-08-17 ENCOUNTER — Ambulatory Visit (INDEPENDENT_AMBULATORY_CARE_PROVIDER_SITE_OTHER)
Admission: RE | Admit: 2022-08-17 | Discharge: 2022-08-17 | Disposition: A | Discharge status: Home / Self Care | Payer: 59 | Source: Ambulatory Visit | Attending: Nurse Practitioner | Admitting: Nurse Practitioner

## 2022-08-17 VITALS — BP 120/80 | HR 118 | Temp 97.4°F | Ht 67.0 in | Wt 202.4 lb

## 2022-08-17 DIAGNOSIS — K219 Gastro-esophageal reflux disease without esophagitis: Secondary | ICD-10-CM | POA: Diagnosis not present

## 2022-08-17 DIAGNOSIS — I10 Essential (primary) hypertension: Secondary | ICD-10-CM

## 2022-08-17 DIAGNOSIS — Z1211 Encounter for screening for malignant neoplasm of colon: Secondary | ICD-10-CM | POA: Diagnosis not present

## 2022-08-17 DIAGNOSIS — E1165 Type 2 diabetes mellitus with hyperglycemia: Secondary | ICD-10-CM

## 2022-08-17 DIAGNOSIS — M7542 Impingement syndrome of left shoulder: Secondary | ICD-10-CM

## 2022-08-17 DIAGNOSIS — M5030 Other cervical disc degeneration, unspecified cervical region: Secondary | ICD-10-CM | POA: Diagnosis not present

## 2022-08-17 DIAGNOSIS — E1169 Type 2 diabetes mellitus with other specified complication: Secondary | ICD-10-CM | POA: Diagnosis not present

## 2022-08-17 DIAGNOSIS — E785 Hyperlipidemia, unspecified: Secondary | ICD-10-CM

## 2022-08-17 DIAGNOSIS — Z3040 Encounter for surveillance of contraceptives, unspecified: Secondary | ICD-10-CM | POA: Diagnosis not present

## 2022-08-17 DIAGNOSIS — E282 Polycystic ovarian syndrome: Secondary | ICD-10-CM

## 2022-08-17 HISTORY — DX: Hyperlipidemia, unspecified: E78.5

## 2022-08-17 LAB — RENAL FUNCTION PANEL
Albumin: 3.8 g/dL (ref 3.5–5.2)
BUN: 8 mg/dL (ref 6–23)
CO2: 26 mEq/L (ref 19–32)
Calcium: 8.5 mg/dL (ref 8.4–10.5)
Chloride: 100 mEq/L (ref 96–112)
Creatinine, Ser: 0.66 mg/dL (ref 0.40–1.20)
GFR: 105.24 mL/min (ref >60.00)
Glucose, Bld: 160 mg/dL — ABNORMAL HIGH (ref 70–99)
Phosphorus: 3.3 mg/dL (ref 2.3–4.6)
Potassium: 4 mEq/L (ref 3.5–5.1)
Sodium: 138 mEq/L (ref 135–145)

## 2022-08-17 LAB — MICROALBUMIN / CREATININE URINE RATIO
Creatinine,U: 185.3 mg/dL
Microalb Creat Ratio: 2.1 mg/g (ref 0.0–30.0)
Microalb, Ur: 4 mg/dL — ABNORMAL HIGH (ref 0.0–1.9)

## 2022-08-17 LAB — LIPID PANEL
Cholesterol: 163 mg/dL (ref 0–200)
HDL: 52.6 mg/dL (ref >39.00)
LDL Cholesterol: 82 mg/dL (ref 0–99)
NonHDL: 110.36
Total CHOL/HDL Ratio: 3
Triglycerides: 144 mg/dL (ref 0.0–149.0)
VLDL: 28.8 mg/dL (ref 0.0–40.0)

## 2022-08-17 LAB — HEMOGLOBIN A1C: Hgb A1c MFr Bld: 8.8 % — ABNORMAL HIGH (ref 4.6–6.5)

## 2022-08-17 MED ORDER — LEVONORGEST-ETH ESTRAD 91-DAY 0.15-0.03 MG PO TABS
1.0000 | ORAL_TABLET | Freq: Every day | ORAL | 3 refills | Status: DC
  Filled 2022-08-17 – 2022-10-30 (×2): qty 91, 91d supply, fill #0
  Filled 2023-01-23: qty 91, 91d supply, fill #1
  Filled 2023-04-18: qty 91, 91d supply, fill #2
  Filled 2023-07-12: qty 91, 91d supply, fill #3

## 2022-08-17 MED ORDER — PANTOPRAZOLE SODIUM 20 MG PO TBEC
20.0000 mg | DELAYED_RELEASE_TABLET | Freq: Every day | ORAL | 3 refills | Status: DC
  Filled 2022-08-17: qty 90, 90d supply, fill #0
  Filled 2022-11-20 (×2): qty 90, 90d supply, fill #1
  Filled 2023-02-20: qty 90, 90d supply, fill #2
  Filled 2023-05-27: qty 90, 90d supply, fill #3

## 2022-08-17 MED ORDER — METFORMIN HCL ER 750 MG PO TB24
750.0000 mg | ORAL_TABLET | Freq: Two times a day (BID) | ORAL | 1 refills | Status: DC
  Filled 2022-08-17: qty 180, 90d supply, fill #0

## 2022-08-17 NOTE — Patient Instructions (Signed)
Decrease pantoprazole to '20mg'$  daily Go to [address] for x-ray Go to lab Maintain current medications

## 2022-08-17 NOTE — Assessment & Plan Note (Signed)
BP at goal with amlodipine and spironolactone Home BP 110s/70s BP Readings from Last 3 Encounters:  08/17/22 120/80  08/01/22 (!) 148/90  07/22/22 (!) 173/94    Maintain med doses Repeat BMP F/up in 66month

## 2022-08-17 NOTE — Assessment & Plan Note (Signed)
No improvement with NSAIDs, muscle relaxant and home exercises.  Cervical radiculopathy vs rotator cuff impingement? Get cervical and shoulder DG.

## 2022-08-17 NOTE — Assessment & Plan Note (Signed)
Improved BP control Maintain COC dose

## 2022-08-17 NOTE — Addendum Note (Signed)
Addended by: Leana Gamer on: 08/17/2022 03:45 PM   Modules accepted: Orders

## 2022-08-17 NOTE — Assessment & Plan Note (Addendum)
Repeat lipid panel: LDL at goal No Statin at this time Repeat labs in 6-73month

## 2022-08-17 NOTE — Assessment & Plan Note (Addendum)
Chronic use of pantoprazole '40mg'$  x 25yr. Symptoms resolved (cough with food intake, waterbrash).  No previous EGD.  Attempt to decrease pantoprazole to '20mg'$  daily. Resume '40mg'$  dose and refer to GI if symptoms return.

## 2022-08-17 NOTE — Assessment & Plan Note (Addendum)
No glucose check at home due to cost of glucometer. Repeat hgbA1c, BMP and urine microalbumin Normal renal function hgbA1c at 8.8%  Increase metformin to '750mg'$  BID F/up in 23month

## 2022-08-17 NOTE — Progress Notes (Addendum)
Established Patient Visit  Patient: Cheryl Welch   DOB: 1975-07-28   47 y.o. Female  MRN: [id number] Visit Date: 08/17/2022  Subjective:    Chief Complaint  Patient presents with   Office Visit    HTN/ DM/ Shoulder pain  Checks BP every other day but hasn't checked BS Still has numbness/tingling in fingers    HPI HTN (hypertension) BP at goal with amlodipine and spironolactone Home BP 110s/70s BP Readings from Last 3 Encounters:  08/17/22 120/80  08/01/22 (!) 148/90  07/22/22 (!) 173/94    Maintain med doses Repeat BMP F/up in 14month  Gastroesophageal reflux disease Chronic use of pantoprazole '40mg'$  x 234yr Symptoms resolved (cough with food intake, waterbrash).  No previous EGD.  Attempt to decrease pantoprazole to '20mg'$  daily. Resume '40mg'$  dose and refer to GI if symptoms return.  DM (diabetes mellitus) (HCMaroaNo glucose check at home due to cost of glucometer. Repeat hgbA1c, BMP and urine microalbumin Normal renal function hgbA1c at 8.8%  Increase metformin to '750mg'$  BID F/up in 53m553monthHyperlipidemia associated with type 2 diabetes mellitus (HCCClintonepeat lipid panel: LDL at goal No Statin at this time Repeat labs in 6-12m46monthncounter for refill of prescription for contraception Improved BP control Maintain COC dose  Left arm pain No improvement with NSAIDs, muscle relaxant and home exercises.  Cervical radiculopathy vs rotator cuff impingement? Get cervical and shoulder DG.  Reviewed medical, surgical, and social history today  Medications: Outpatient Medications Prior to Visit  Medication Sig   amLODipine (NORVASC) 5 MG tablet Take 1 tablet (5 mg total) by mouth daily.   cyclobenzaprine (FLEXERIL) 5 MG tablet Take 1 tablet (5 mg total) by mouth 2 (two) times daily.   ibuprofen (ADVIL) 800 MG tablet Take 1 tablet (800 mg total) by mouth every 8 (eight) hours as needed (with food).   spironolactone (ALDACTONE) 100 MG tablet TAKE 1  TABLET (100 MG TOTAL) BY MOUTH 2 (TWO) TIMES DAILY. PT NEED APPT FOR FUTURE REFILLS   valACYclovir (VALTREX) 500 MG tablet TAKE 1 TABLET (500 MG TOTAL) BY MOUTH DAILY.   [DISCONTINUED] levonorgestrel-ethinyl estradiol (SEASONALE) 0.15-0.03 MG tablet Take 1 tablet by mouth daily.   [DISCONTINUED] metFORMIN (GLUCOPHAGE-XR) 750 MG 24 hr tablet Take 1 tablet (750 mg total) by mouth daily with breakfast.   [DISCONTINUED] blood glucose meter kit and supplies Use once daily in the morning. (Patient not taking: Reported on 08/17/2022)   [DISCONTINUED] Blood Pressure Monitoring (OMRON 3 SERIES BP MONITOR) DEVI Use as directed. (Patient not taking: Reported on 08/17/2022)   [DISCONTINUED] glucose blood (FREESTYLE LITE) test strip Use once daily in the morning. (Patient not taking: Reported on 08/17/2022)   [DISCONTINUED] Lancets (FREESTYLE) lancets Use once daily in the morning. (Patient not taking: Reported on 08/17/2022)   [DISCONTINUED] pantoprazole (PROTONIX) 40 MG tablet TAKE 1 TABLET (40 MG TOTAL) BY MOUTH DAILY.   No facility-administered medications prior to visit.   Reviewed past medical and social history.   ROS per HPI above  Last metabolic panel Lab Results  Component Value Date   GLUCOSE 160 (H) 08/17/2022   NA 138 08/17/2022   K 4.0 08/17/2022   CL 100 08/17/2022   CO2 26 08/17/2022   BUN 8 08/17/2022   CREATININE 0.66 08/17/2022   CALCIUM 8.5 08/17/2022   PHOS 3.3 08/17/2022   PROT 8.0 12/20/2018   ALBUMIN 3.8 08/17/2022   BILITOT  0.6 12/20/2018   ALKPHOS 159 (H) 12/20/2018   AST 11 12/20/2018   ALT 12 12/20/2018   Last lipids Lab Results  Component Value Date   CHOL 163 08/17/2022   HDL 52.60 08/17/2022   LDLCALC 82 08/17/2022   TRIG 144.0 08/17/2022   CHOLHDL 3 08/17/2022   Last hemoglobin A1c Lab Results  Component Value Date   HGBA1C 8.8 (H) 08/17/2022        Objective:  BP 120/80   Pulse (!) 118   Temp (!) 97.4 F (36.3 C) (Temporal)   Ht '5\' 7"'$  (1.702 m)    Wt 202 lb 6.4 oz (91.8 kg)   SpO2 98%   BMI 31.70 kg/m      Physical Exam Vitals reviewed.  Cardiovascular:     Rate and Rhythm: Normal rate and regular rhythm.     Pulses: Normal pulses.     Heart sounds: Normal heart sounds.  Musculoskeletal:        General: Tenderness present.     Left shoulder: Tenderness present. No effusion or crepitus. Normal range of motion. Normal strength. Normal pulse.     Left upper arm: Normal.     Left elbow: Normal.     Left forearm: Normal.     Left wrist: Normal.     Left hand: Normal.     Cervical back: Normal, normal range of motion and neck supple. No tenderness.     Thoracic back: Normal.  Lymphadenopathy:     Cervical: No cervical adenopathy.  Skin:    General: Skin is warm and dry.  Neurological:     Mental Status: She is alert and oriented to person, place, and time.     Results for orders placed or performed in visit on 08/17/22  Lipid panel  Result Value Ref Range   Cholesterol 163 0 - 200 mg/dL   Triglycerides 144.0 0.0 - 149.0 mg/dL   HDL 52.60 >39.00 mg/dL   VLDL 28.8 0.0 - 40.0 mg/dL   LDL Cholesterol 82 0 - 99 mg/dL   Total CHOL/HDL Ratio 3    NonHDL 110.36   Hemoglobin A1c  Result Value Ref Range   Hgb A1c MFr Bld 8.8 (H) 4.6 - 6.5 %  Microalbumin / creatinine urine ratio  Result Value Ref Range   Microalb, Ur 4.0 (H) 0.0 - 1.9 mg/dL   Creatinine,U 185.3 mg/dL   Microalb Creat Ratio 2.1 0.0 - 30.0 mg/g  Renal Function Panel  Result Value Ref Range   Sodium 138 135 - 145 mEq/L   Potassium 4.0 3.5 - 5.1 mEq/L   Chloride 100 96 - 112 mEq/L   CO2 26 19 - 32 mEq/L   Albumin 3.8 3.5 - 5.2 g/dL   BUN 8 6 - 23 mg/dL   Creatinine, Ser 0.66 0.40 - 1.20 mg/dL   Glucose, Bld 160 (H) 70 - 99 mg/dL   Phosphorus 3.3 2.3 - 4.6 mg/dL   GFR 105.24 >60.00 mL/min   Calcium 8.5 8.4 - 10.5 mg/dL      Assessment & Plan:    Problem List Items Addressed This Visit       Cardiovascular and Mediastinum   HTN (hypertension)  - Primary    BP at goal with amlodipine and spironolactone Home BP 110s/70s BP Readings from Last 3 Encounters:  08/17/22 120/80  08/01/22 (!) 148/90  07/22/22 (!) 173/94    Maintain med doses Repeat BMP F/up in 50month      Relevant Orders  Renal Function Panel (Completed)     Digestive   Gastroesophageal reflux disease    Chronic use of pantoprazole '40mg'$  x 42yr. Symptoms resolved (cough with food intake, waterbrash).  No previous EGD.  Attempt to decrease pantoprazole to '20mg'$  daily. Resume '40mg'$  dose and refer to GI if symptoms return.      Relevant Medications   pantoprazole (PROTONIX) 20 MG tablet     Endocrine   DM (diabetes mellitus) (HWilson    No glucose check at home due to cost of glucometer. Repeat hgbA1c, BMP and urine microalbumin Normal renal function hgbA1c at 8.8%  Increase metformin to '750mg'$  BID F/up in 342month     Relevant Medications   metFORMIN (GLUCOPHAGE-XR) 750 MG 24 hr tablet   Other Relevant Orders   Hemoglobin A1c (Completed)   Microalbumin / creatinine urine ratio (Completed)   Renal Function Panel (Completed)   Hyperlipidemia associated with type 2 diabetes mellitus (HCC)    Repeat lipid panel: LDL at goal No Statin at this time Repeat labs in 6-1268month    Relevant Medications   metFORMIN (GLUCOPHAGE-XR) 750 MG 24 hr tablet   Other Relevant Orders   Lipid panel (Completed)     Other   Encounter for refill of prescription for contraception    Improved BP control Maintain COC dose      Relevant Medications   levonorgestrel-ethinyl estradiol (SEASONALE) 0.15-0.03 MG tablet   Left arm pain    No improvement with NSAIDs, muscle relaxant and home exercises.  Cervical radiculopathy vs rotator cuff impingement? Get cervical and shoulder DG.      Other Visit Diagnoses     Colon cancer screening       Relevant Orders   Ambulatory referral to Gastroenterology   PCOS (polycystic ovarian syndrome)       Relevant Medications    metFORMIN (GLUCOPHAGE-XR) 750 MG 24 hr tablet      Return in about 3 months (around 11/16/2022) for DM, HTN, hyperlipidemia (fasting).     ChaWilfred LacyP

## 2022-08-20 DIAGNOSIS — M5030 Other cervical disc degeneration, unspecified cervical region: Secondary | ICD-10-CM | POA: Insufficient documentation

## 2022-08-20 NOTE — Addendum Note (Signed)
Addended by: Leana Gamer on: 08/20/2022 08:42 PM   Modules accepted: Orders

## 2022-08-23 ENCOUNTER — Ambulatory Visit (INDEPENDENT_AMBULATORY_CARE_PROVIDER_SITE_OTHER): Payer: 59 | Admitting: Physician Assistant

## 2022-08-23 ENCOUNTER — Encounter: Payer: Self-pay | Admitting: Physician Assistant

## 2022-08-23 ENCOUNTER — Other Ambulatory Visit (HOSPITAL_BASED_OUTPATIENT_CLINIC_OR_DEPARTMENT_OTHER): Payer: Self-pay

## 2022-08-23 DIAGNOSIS — M5030 Other cervical disc degeneration, unspecified cervical region: Secondary | ICD-10-CM

## 2022-08-23 MED ORDER — METHYLPREDNISOLONE 4 MG PO TBPK
ORAL_TABLET | ORAL | 0 refills | Status: DC
  Filled 2022-08-23: qty 21, 6d supply, fill #0

## 2022-08-23 NOTE — Progress Notes (Signed)
Office Visit Note   Patient: Cheryl Welch           Date of Birth: 04-30-1976           MRN: [id number] Visit Date: 08/23/2022              Requested by: Flossie Buffy, NP [place],  [place] [address] PCP: Flossie Buffy, NP   Assessment & Plan: Visit Diagnoses:  1. DDD (degenerative disc disease), cervical     Plan: Cheryl Welch is a pleasant 47 year old woman with a 1 month history of pain in her left scapula that radiates down her arm and has associated paresthesias and mostly her index middle and ring finger.  Denies any injuries.  Was seen in the med center about a month ago for this.  She states that she was given a Medrol Dosepak she thinks.  She said it helped a little bit with the pain but not with the paresthesias.  She did see her primary care x-rays taken of her neck and shoulder.  Shoulder minimal degenerative changes neck she has straightening of the normal lordotic curve and degenerative changes specially at C5-C6.  I think most of her symptoms and exam are consistent with neck pain and cervical radiculopathy.  She would like to try 1 more course of an oral steroid I think this would be fine.  Also will send her for some physical therapy.  She understands if this progresses and gets worse instead of better she should call me at any time we will order an MRI.  Otherwise she will see how she does after she completes physical therapy  Follow-Up Instructions: Return if symptoms worsen or fail to improve.   Orders:  Orders Placed This Encounter  Procedures   Ambulatory referral to Physical Therapy   Meds ordered this encounter  Medications   methylPREDNISolone (MEDROL DOSEPAK) 4 MG TBPK tablet    Sig: Take as directed with food    Dispense:  21 tablet    Refill:  0      Procedures: No procedures performed   Clinical Data: No additional findings.   Subjective: Chief Complaint  Patient presents with   Neck - Pain   Left Shoulder -  Pain    HPI pleasant 47 year old woman with a 1 month history of pain in her scapula on the left radiates with numbing and tingling down her arm into her index middle and ring fingers.  Denies any injury.  Has had x-rays of both her shoulder and her neck.  More degenerative changes noted in her cervical neck films  Review of Systems  All other systems reviewed and are negative.    Objective: Vital Signs: LMP  (Exact Date)   Physical Exam Constitutional:      Appearance: Normal appearance.  Skin:    General: Skin is warm and dry.  Neurological:     Mental Status: She is alert.     Ortho Exam Left shoulder she has full forward elevation internal rotation behind her back.  She has negative impingement findings negative empty can test she has strength 5 out of 5 with resisted external rotation and abduction.  She does have some tenderness in her left scapula.  She has some paresthesias today in her index and middle finger.  Her grip strength however is strong.  She has equal strength 5 out of 5 with biceps and triceps testing.  No atrophy noted.  When she extends her neck  and turns towards the left it does reproduce symptoms in her scapula Specialty Comments:  No specialty comments available.  Imaging: No results found.   PMFS History: Patient Active Problem List   Diagnosis Date Noted   DDD (degenerative disc disease), cervical 08/20/2022   Hyperlipidemia associated with type 2 diabetes mellitus ([place]) 08/17/2022   Encounter for refill of prescription for contraception 08/01/2022   Shoulder impingement syndrome, left 08/01/2022   HTN (hypertension) 07/05/2021   Vitamin D deficiency 12/27/2018   DM (diabetes mellitus) ([place]) 10/17/2017   Obesity (BMI 30-39.9) 10/17/2017   Left flank pain 10/17/2017   Gastroesophageal reflux disease 02/15/2017   Constipation 02/15/2017   Hematuria 09/03/2015   Herpes simplex 09/03/2015   Hemorrhoidal skin tag 09/03/2015   Alopecia 07/20/2015    Functional dyspepsia 07/20/2015   Hypothyroidism 07/20/2015   Polycystic ovarian disease 05/05/2011   Past Medical History:  Diagnosis Date   Abnormal Pap smear    2004; LGSIL??>colpo>nml   Abnormal thyroid function test    Allergy    Arthritis    Diabetes mellitus without complication ([place])    DM (diabetes mellitus) ([place]) 10/17/2017   Encounter for refill of prescription for contraception 08/01/2022   GERD (gastroesophageal reflux disease)    Herpes simplex 09/03/2015   Hirsutism    HSV infection    HTN (hypertension) 07/05/2021   Hyperlipidemia associated with type 2 diabetes mellitus ([place]) 08/17/2022   Hypothyroidism 07/20/2015   PCO (polycystic ovaries)    Prediabetes     Family History  Problem Relation Age of Onset   Hypertension Mother    Cancer Father        kidney   Hypertension Father    Colon polyps Father    Other Father        esophageal stricture   Diabetes Maternal Grandmother    Colon cancer Neg Hx    Esophageal cancer Neg Hx    Breast cancer Neg Hx     Past Surgical History:  Procedure Laterality Date   CESAREAN SECTION  07/24/1992   Social History   Occupational History   Occupation: Producer, television/film/video: [employer]  Tobacco Use   Smoking status: Never   Smokeless tobacco: Never  Vaping Use   Vaping Use: Never used  Substance and Sexual Activity   Alcohol use: Yes    Comment: occassionally   Drug use: No   Sexual activity: Yes    Partners: Male    Birth control/protection: Pill

## 2022-08-24 ENCOUNTER — Other Ambulatory Visit (HOSPITAL_BASED_OUTPATIENT_CLINIC_OR_DEPARTMENT_OTHER): Payer: Self-pay

## 2022-08-24 ENCOUNTER — Other Ambulatory Visit: Payer: Self-pay | Admitting: Nurse Practitioner

## 2022-08-24 DIAGNOSIS — E282 Polycystic ovarian syndrome: Secondary | ICD-10-CM

## 2022-08-24 MED ORDER — SPIRONOLACTONE 100 MG PO TABS
ORAL_TABLET | Freq: Two times a day (BID) | ORAL | 0 refills | Status: DC
  Filled 2022-08-24: qty 60, 30d supply, fill #0

## 2022-08-24 NOTE — Telephone Encounter (Signed)
Chart supports Rx Last OV: 07/2022 Next OV: 10/2022

## 2022-08-29 ENCOUNTER — Ambulatory Visit: Payer: 59 | Admitting: Physician Assistant

## 2022-09-11 NOTE — Therapy (Incomplete)
OUTPATIENT PHYSICAL THERAPY CERVICAL EVALUATION   Patient Name: Cheryl Welch MRN: [id number] DOB:[birth year], 47 y.o., female Today's Date: 09/11/2022   END OF SESSION:   Past Medical History:  Diagnosis Date   Abnormal Pap smear    2004; LGSIL??>colpo>nml   Abnormal thyroid function test    Allergy    Arthritis    Diabetes mellitus without complication ([place])    DM (diabetes mellitus) ([place]) 10/17/2017   Encounter for refill of prescription for contraception 08/01/2022   GERD (gastroesophageal reflux disease)    Herpes simplex 09/03/2015   Hirsutism    HSV infection    HTN (hypertension) 07/05/2021   Hyperlipidemia associated with type 2 diabetes mellitus ([place]) 08/17/2022   Hypothyroidism 07/20/2015   PCO (polycystic ovaries)    Prediabetes    Past Surgical History:  Procedure Laterality Date   CESAREAN SECTION  07/24/1992   Patient Active Problem List   Diagnosis Date Noted   DDD (degenerative disc disease), cervical 08/20/2022   Hyperlipidemia associated with type 2 diabetes mellitus ([place]) 08/17/2022   Encounter for refill of prescription for contraception 08/01/2022   Shoulder impingement syndrome, left 08/01/2022   HTN (hypertension) 07/05/2021   Vitamin D deficiency 12/27/2018   DM (diabetes mellitus) ([place]) 10/17/2017   Obesity (BMI 30-39.9) 10/17/2017   Left flank pain 10/17/2017   Gastroesophageal reflux disease 02/15/2017   Constipation 02/15/2017   Hematuria 09/03/2015   Herpes simplex 09/03/2015   Hemorrhoidal skin tag 09/03/2015   Alopecia 07/20/2015   Functional dyspepsia 07/20/2015   Hypothyroidism 07/20/2015   Polycystic ovarian disease 05/05/2011    PCP: Cheryl Buffy, NP   REFERRING PROVIDER: Persons, Cheryl Palmer, [place]   REFERRING DIAG: M50.30 (ICD-10-CM) - DDD (degenerative disc disease), cervical   THERAPY DIAG:  No diagnosis found.  RATIONALE FOR EVALUATION AND TREATMENT: Rehabilitation  ONSET DATE: ***  NEXT MD VISIT: ***  none scheduled   SUBJECTIVE:                                                                                                                                                                                                         SUBJECTIVE STATEMENT: ***  1 month history of pain in her left scapula that radiates down her arm and has associated paresthesias and mostly her index middle and ring finger.   PAIN: Are you having pain? {OPRCPAIN:27236}  PERTINENT HISTORY:  ***Cervical DDD; L shoulder impingement syndrome; DM-II; HTN; obesity; GERD; hypothyroidism  PRECAUTIONS: {Therapy precautions:24002}  WEIGHT BEARING RESTRICTIONS: {Yes ***/No:24003}  FALLS:  Has patient fallen in last  6 months? {fallsyesno:27318}  LIVING ENVIRONMENT: Lives with: {OPRC lives with:25569::"lives with their family"} Lives in: {Lives in:25570} Stairs: {opstairs:27293} Has following equipment at home: {Assistive devices:23999}  OCCUPATION: ***  PLOF: {PLOF:24004}  PATIENT GOALS: ***   OBJECTIVE:   DIAGNOSTIC FINDINGS:  ***08/17/22 - Cervical spine x-ray: IMPRESSION: Mild-to-moderate degenerative disc disease in the mid to lower cervical spine, most prominent at C5-6. Minimal 2 mm retrolisthesis at C5-6.  08/17/22 - L shoulder x-ray: IMPRESSION: AC joint and glenohumeral degenerative changes.  PATIENT SURVEYS:  {rehab surveys:24030}  COGNITION: Overall cognitive status: {cognition:24006}  SENSATION: {sensation:27233}  POSTURE:  {posture:25561}  PALPATION: ***   CERVICAL ROM:   {AROM/PROM:27142} ROM A/PROM  eval  Flexion   Extension   Right lateral flexion   Left lateral flexion   Right rotation   Left rotation    (Blank rows = not tested)  UPPER EXTREMITY ROM:  {AROM/PROM:27142} ROM Right eval Left eval  Shoulder flexion    Shoulder extension    Shoulder abduction    Shoulder adduction    Shoulder internal rotation    Shoulder external rotation    Elbow flexion     Elbow extension    Wrist flexion    Wrist extension    Wrist ulnar deviation    Wrist radial deviation    Wrist pronation    Wrist supination     (Blank rows = not tested)  UPPER EXTREMITY MMT:  MMT Right eval Left eval  Shoulder flexion    Shoulder extension    Shoulder abduction    Shoulder adduction    Shoulder internal rotation    Shoulder external rotation    Middle trapezius    Lower trapezius    Elbow flexion    Elbow extension    Wrist flexion    Wrist extension    Wrist ulnar deviation    Wrist radial deviation    Wrist pronation    Wrist supination    Grip strength     (Blank rows = not tested)  CERVICAL SPECIAL TESTS:  {Cervical special tests:25246}  FUNCTIONAL TESTS:  {Functional tests:24029}   TODAY'S TREATMENT:  ***   PATIENT EDUCATION:  Education details: {Education details:27468}  Person educated: {Person educated:25204} Education method: {Education Method:25205} Education comprehension: {Education Comprehension:25206}  HOME EXERCISE PROGRAM: ***  ASSESSMENT:  CLINICAL IMPRESSION: Cheryl Welch is a 47 y.o. female  who was seen today for physical therapy evaluation and treatment for cervical radiculopathy secondary to cervical DDD. ***   OBJECTIVE IMPAIRMENTS: {opptimpairments:25111}.   ACTIVITY LIMITATIONS: {activitylimitations:27494}  PARTICIPATION LIMITATIONS: {participationrestrictions:25113}  PERSONAL FACTORS: {Personal factors:25162} are also affecting patient's functional outcome.   REHAB POTENTIAL: {rehabpotential:25112}  CLINICAL DECISION MAKING: {clinical decision making:25114}  EVALUATION COMPLEXITY: {Evaluation complexity:25115}   GOALS: Goals reviewed with patient? {yes/no:20286}  SHORT TERM GOALS: Target date: ***  Patient will be independent with initial HEP to improve outcomes and carryover.  Baseline: *** Goal status: {GOALSTATUS:25110}  2.  ***  Baseline: *** Goal status:  {GOALSTATUS:25110}  3.  *** Baseline: *** Goal status: {GOALSTATUS:25110}  LONG TERM GOALS: Target date: ***  Patient will be independent with ongoing/advanced HEP for self-management at home.  Baseline: *** Goal status: {GOALSTATUS:25110}  2.  Patient will demonstrate improved posture to decrease muscle imbalance. Baseline: *** Goal status: {GOALSTATUS:25110}  3.  Patient will report 75% improvement in neck pain to improve QOL.  Baseline: *** Goal status: {GOALSTATUS:25110}  4.  Patient to report 50-75% reduction in frequency and intensity of weekly headaches/migraines.  Baseline: *** Goal status: {GOALSTATUS:25110}   5.  Patient will demonstrate full pain free cervical ROM for safety with driving.  Baseline: *** Goal status: {GOALSTATUS:25110}  6.  Patient will report *** on ***(patient outcome measure)  to demonstrate improved functional ability.  Baseline: *** Goal status: {GOALSTATUS:25110}  7.  Patient will ***   Baseline: *** Goal status: {GOALSTATUS:25110}   8. *** Baseline: *** Goal status: {GOALSTATUS:25110}   PLAN:  PT FREQUENCY: {rehab frequency:25116}  PT DURATION: {rehab duration:25117}  PLANNED INTERVENTIONS: {rehab planned interventions:25118::"Therapeutic exercises","Therapeutic activity","Neuromuscular re-education","Balance training","Gait training","Patient/Family education","Self Care","Joint mobilization"}  PLAN FOR NEXT SESSION: ***   Percival Spanish, PT 09/11/2022, 9:43 AM

## 2022-09-12 ENCOUNTER — Ambulatory Visit: Payer: 59 | Admitting: Physical Therapy

## 2022-09-12 ENCOUNTER — Other Ambulatory Visit: Payer: Self-pay

## 2022-09-14 ENCOUNTER — Ambulatory Visit: Payer: 59

## 2022-09-19 ENCOUNTER — Ambulatory Visit: Payer: 59 | Admitting: Physical Therapy

## 2022-09-21 ENCOUNTER — Ambulatory Visit: Payer: 59

## 2022-10-02 ENCOUNTER — Encounter: Payer: 59 | Admitting: Physical Therapy

## 2022-10-04 ENCOUNTER — Other Ambulatory Visit (HOSPITAL_BASED_OUTPATIENT_CLINIC_OR_DEPARTMENT_OTHER): Payer: Self-pay

## 2022-10-10 ENCOUNTER — Encounter: Payer: 59 | Admitting: Physical Therapy

## 2022-10-23 ENCOUNTER — Encounter: Payer: Self-pay | Admitting: Nurse Practitioner

## 2022-10-30 ENCOUNTER — Other Ambulatory Visit: Payer: Self-pay | Admitting: Nurse Practitioner

## 2022-10-30 ENCOUNTER — Other Ambulatory Visit (HOSPITAL_BASED_OUTPATIENT_CLINIC_OR_DEPARTMENT_OTHER): Payer: Self-pay

## 2022-10-30 DIAGNOSIS — I10 Essential (primary) hypertension: Secondary | ICD-10-CM

## 2022-10-30 DIAGNOSIS — E282 Polycystic ovarian syndrome: Secondary | ICD-10-CM

## 2022-10-30 MED ORDER — SPIRONOLACTONE 100 MG PO TABS
100.0000 mg | ORAL_TABLET | Freq: Two times a day (BID) | ORAL | 0 refills | Status: DC
  Filled 2022-10-30: qty 60, 30d supply, fill #0

## 2022-10-30 MED ORDER — AMLODIPINE BESYLATE 5 MG PO TABS
5.0000 mg | ORAL_TABLET | Freq: Every day | ORAL | 0 refills | Status: DC
  Filled 2022-10-30: qty 90, 90d supply, fill #0

## 2022-11-17 ENCOUNTER — Ambulatory Visit: Payer: 59 | Admitting: Nurse Practitioner

## 2022-11-20 ENCOUNTER — Other Ambulatory Visit (HOSPITAL_BASED_OUTPATIENT_CLINIC_OR_DEPARTMENT_OTHER): Payer: Self-pay

## 2022-11-20 ENCOUNTER — Encounter: Payer: Self-pay | Admitting: Nurse Practitioner

## 2022-11-20 ENCOUNTER — Ambulatory Visit: Payer: 59 | Admitting: Nurse Practitioner

## 2022-11-20 ENCOUNTER — Other Ambulatory Visit: Payer: Self-pay

## 2022-11-20 VITALS — BP 120/86 | HR 111 | Temp 98.0°F | Resp 16 | Ht 67.0 in | Wt 198.8 lb

## 2022-11-20 DIAGNOSIS — E1169 Type 2 diabetes mellitus with other specified complication: Secondary | ICD-10-CM | POA: Diagnosis not present

## 2022-11-20 DIAGNOSIS — I10 Essential (primary) hypertension: Secondary | ICD-10-CM | POA: Diagnosis not present

## 2022-11-20 DIAGNOSIS — E282 Polycystic ovarian syndrome: Secondary | ICD-10-CM | POA: Diagnosis not present

## 2022-11-20 LAB — POCT GLYCOSYLATED HEMOGLOBIN (HGB A1C)
HbA1c POC (<> result, manual entry): 0 % (ref 4.0–5.6)
HbA1c, POC (controlled diabetic range): 0 % — AB (ref 0.0–7.0)
HbA1c, POC (prediabetic range): 0 % — AB (ref 5.7–6.4)
Hemoglobin A1C: 7.6 % — AB (ref 4.0–5.6)

## 2022-11-20 MED ORDER — AMLODIPINE BESYLATE 5 MG PO TABS
5.0000 mg | ORAL_TABLET | Freq: Every day | ORAL | 3 refills | Status: DC
  Filled 2022-11-20 – 2023-01-29 (×2): qty 90, 90d supply, fill #0
  Filled 2023-04-29: qty 90, 90d supply, fill #1
  Filled 2023-07-31: qty 90, 90d supply, fill #2

## 2022-11-20 MED ORDER — SPIRONOLACTONE 100 MG PO TABS
100.0000 mg | ORAL_TABLET | Freq: Two times a day (BID) | ORAL | 1 refills | Status: DC
  Filled 2022-11-20 – 2022-12-29 (×3): qty 180, 90d supply, fill #0
  Filled 2023-04-18: qty 180, 90d supply, fill #1

## 2022-11-20 MED ORDER — METFORMIN HCL ER 750 MG PO TB24
750.0000 mg | ORAL_TABLET | Freq: Every day | ORAL | 1 refills | Status: DC
  Filled 2022-11-20: qty 90, 90d supply, fill #0
  Filled 2023-02-20: qty 90, 90d supply, fill #1

## 2022-11-20 NOTE — Assessment & Plan Note (Signed)
No glucose check at home Unable to tolerate metformin 750mg  BID (nasuea). Currently taking 750mg  daily. Normal foot exam Normal UACr  Repeat hgbA1c 7.6%: improved Maintain metformin 750mg  daily F/up in 3months

## 2022-11-20 NOTE — Patient Instructions (Addendum)
Schedule appt for colonoscopy hgbA1c at 7.6% Maintain current med doses Maintain upcoming appt in July for CPE (fasting)

## 2022-11-20 NOTE — Assessment & Plan Note (Signed)
BP at goal with amlodipine and spironolactone Home BP 120s/70s BP Readings from Last 3 Encounters:  11/20/22 120/86  08/17/22 120/80  08/01/22 (!) 148/90    Maintain med doses F/up in 3months

## 2022-11-20 NOTE — Progress Notes (Signed)
Established Patient Visit  Patient: Cheryl Welch   DOB: [birth year]   47 y.o. Female  MRN: [id number] Visit Date: 11/20/2022  Subjective:    Chief Complaint  Patient presents with   Medical Management of Chronic Issues    Fasting- Yes.  Refill of Amlodipine    HPI HTN (hypertension) BP at goal with amlodipine and spironolactone Home BP 120s/70s BP Readings from Last 3 Encounters:  11/20/22 120/86  08/17/22 120/80  08/01/22 (!) 148/90    Maintain med doses F/up in 3months  DM (diabetes mellitus) (HCC) No glucose check at home Unable to tolerate metformin 750mg  BID (nasuea). Currently taking 750mg  daily. Normal foot exam Normal UACr  Repeat hgbA1c 7.6%: improved Maintain metformin 750mg  daily F/up in 3months Exercise: walking, 5000step EOD  Wt Readings from Last 3 Encounters:  11/20/22 198 lb 12.8 oz (90.2 kg)  08/17/22 202 lb 6.4 oz (91.8 kg)  08/01/22 202 lb 12.8 oz (92 kg)    Reviewed medical, surgical, and social history today  Medications: Outpatient Medications Prior to Visit  Medication Sig   cyclobenzaprine (FLEXERIL) 5 MG tablet Take 1 tablet (5 mg total) by mouth 2 (two) times daily.   ibuprofen (ADVIL) 800 MG tablet Take 1 tablet (800 mg total) by mouth every 8 (eight) hours as needed (with food).   levonorgestrel-ethinyl estradiol (SEASONALE) 0.15-0.03 MG tablet Take 1 tablet by mouth daily.   pantoprazole (PROTONIX) 20 MG tablet Take 1 tablet (20 mg total) by mouth daily.   valACYclovir (VALTREX) 500 MG tablet Take 500 mg by mouth 2 (two) times daily.   [DISCONTINUED] amLODipine (NORVASC) 5 MG tablet Take 1 tablet (5 mg total) by mouth daily.   [DISCONTINUED] metFORMIN (GLUCOPHAGE-XR) 750 MG 24 hr tablet Take 1 tablet (750 mg total) by mouth 2 (two) times daily.   [DISCONTINUED] spironolactone (ALDACTONE) 100 MG tablet Take 1 tablet (100 mg total) by mouth 2 (two) times daily. (Need appointment for refills)   [DISCONTINUED]  methylPREDNISolone (MEDROL DOSEPAK) 4 MG TBPK tablet Take 6 tablets (60 mg total) by mouth on day 1, then 5 tablets (50 mg total) on day 2, then 4 tablets (40 mg total) on day 3, then 3 tablets (30 mg total) on day 4, then 2 tablets (20 mg total) on day 5, and then 1 tablet (10 mg total) on day 6. (Patient not taking: Reported on 11/20/2022)   No facility-administered medications prior to visit.   Reviewed past medical and social history.   ROS per HPI above      Objective:  BP 120/86 (BP Location: Right Arm, Patient Position: Sitting, Cuff Size: Large)   Pulse (!) 111   Temp 98 F (36.7 C) (Temporal)   Resp 16   Ht 5\' 7"  (1.702 m)   Wt 198 lb 12.8 oz (90.2 kg)   SpO2 99%   BMI 31.14 kg/m      Physical Exam Vitals reviewed.  Cardiovascular:     Rate and Rhythm: Normal rate and regular rhythm.     Pulses: Normal pulses.     Heart sounds: Normal heart sounds.  Pulmonary:     Effort: Pulmonary effort is normal.     Breath sounds: Normal breath sounds.  Musculoskeletal:     Right lower leg: No edema.     Left lower leg: No edema.  Neurological:     Mental Status: She is alert and oriented to person,  place, and time.     Results for orders placed or performed in visit on 11/20/22  POCT glycosylated hemoglobin (Hb A1C)  Result Value Ref Range   Hemoglobin A1C 7.6 (A) 4.0 - 5.6 %   HbA1c POC (<> result, manual entry) 0 4.0 - 5.6 %   HbA1c, POC (prediabetic range) 0 (A) 5.7 - 6.4 %   HbA1c, POC (controlled diabetic range) 0.0 (A) 0.0 - 7.0 %      Assessment & Plan:    Problem List Items Addressed This Visit       Cardiovascular and Mediastinum   HTN (hypertension)    BP at goal with amlodipine and spironolactone Home BP 120s/70s BP Readings from Last 3 Encounters:  11/20/22 120/86  08/17/22 120/80  08/01/22 (!) 148/90    Maintain med doses F/up in 3months      Relevant Medications   amLODipine (NORVASC) 5 MG tablet   spironolactone (ALDACTONE) 100 MG tablet      Endocrine   DM (diabetes mellitus) (HCC) - Primary    No glucose check at home Unable to tolerate metformin 750mg  BID (nasuea). Currently taking 750mg  daily. Normal foot exam Normal UACr  Repeat hgbA1c 7.6%: improved Maintain metformin 750mg  daily F/up in 3months      Relevant Medications   metFORMIN (GLUCOPHAGE-XR) 750 MG 24 hr tablet   Other Relevant Orders   POCT glycosylated hemoglobin (Hb A1C) (Completed)   Other Visit Diagnoses     PCOS (polycystic ovarian syndrome)       Relevant Medications   metFORMIN (GLUCOPHAGE-XR) 750 MG 24 hr tablet   spironolactone (ALDACTONE) 100 MG tablet      Return for Maintain upcoming appt in July for CPE (fasting).     Alysia Penna, NP

## 2022-11-23 ENCOUNTER — Other Ambulatory Visit (HOSPITAL_BASED_OUTPATIENT_CLINIC_OR_DEPARTMENT_OTHER): Payer: Self-pay

## 2022-12-01 ENCOUNTER — Other Ambulatory Visit (HOSPITAL_BASED_OUTPATIENT_CLINIC_OR_DEPARTMENT_OTHER): Payer: Self-pay

## 2022-12-29 ENCOUNTER — Other Ambulatory Visit (HOSPITAL_BASED_OUTPATIENT_CLINIC_OR_DEPARTMENT_OTHER): Payer: Self-pay

## 2022-12-29 ENCOUNTER — Other Ambulatory Visit: Payer: Self-pay

## 2023-01-03 IMAGING — MG DIGITAL SCREENING BILAT W/ CAD
5 series · 5 of 5 positions shown · non-contrast
Comparison: Previous exam(s).

CLINICAL DATA: Screening.

EXAM:
DIGITAL SCREENING BILATERAL MAMMOGRAM WITH CAD
TECHNIQUE: Bilateral screening digital craniocaudal and mediolateral oblique
mammograms were obtained. The images were evaluated with
computer-aided detection.

[R CC (1 of 2)]
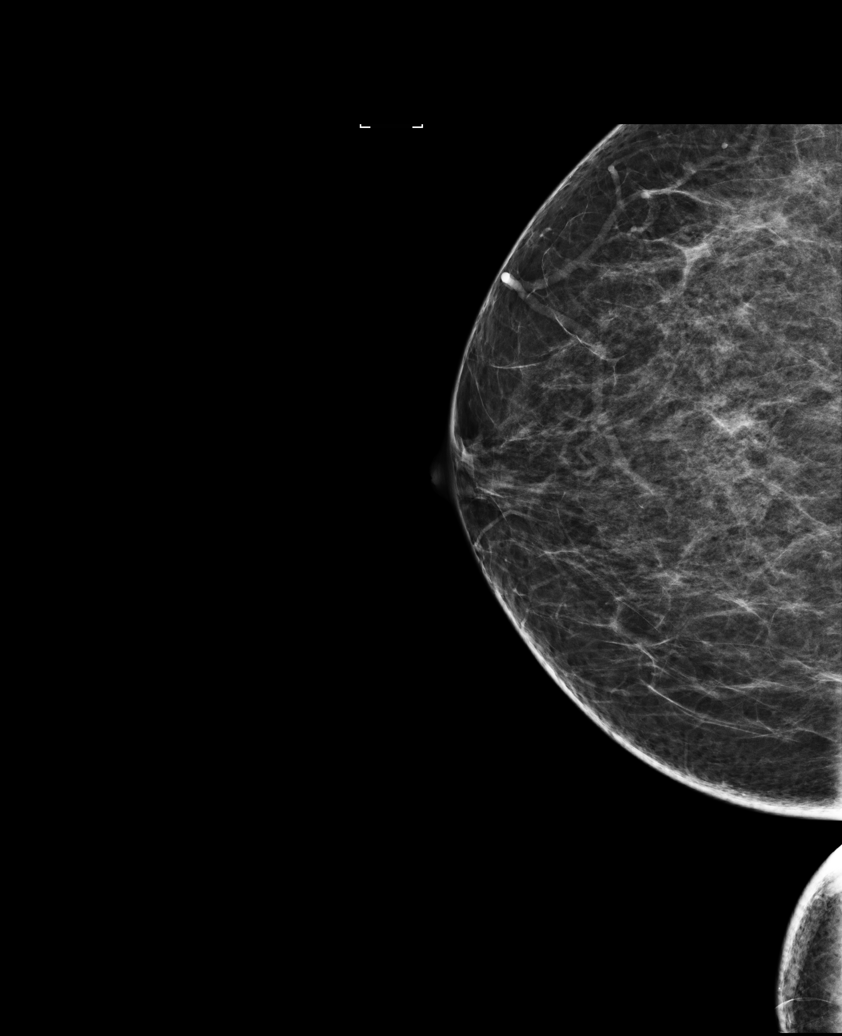

[L MLO]
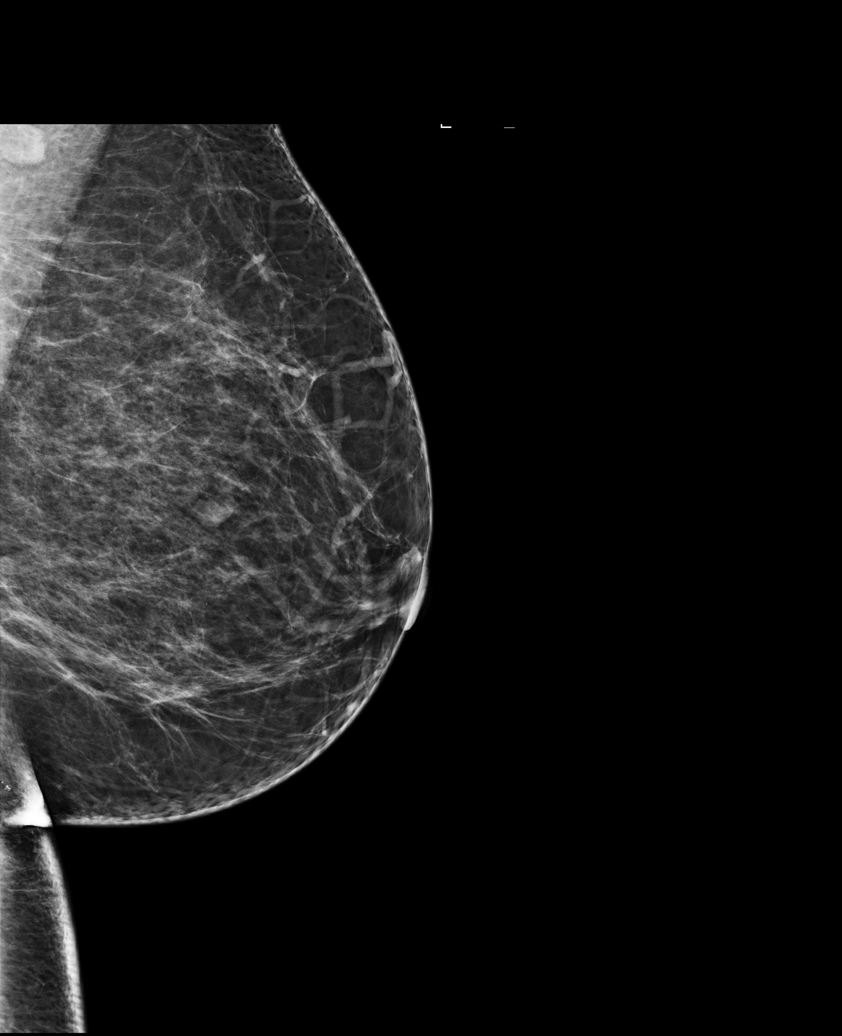

[R MLO]
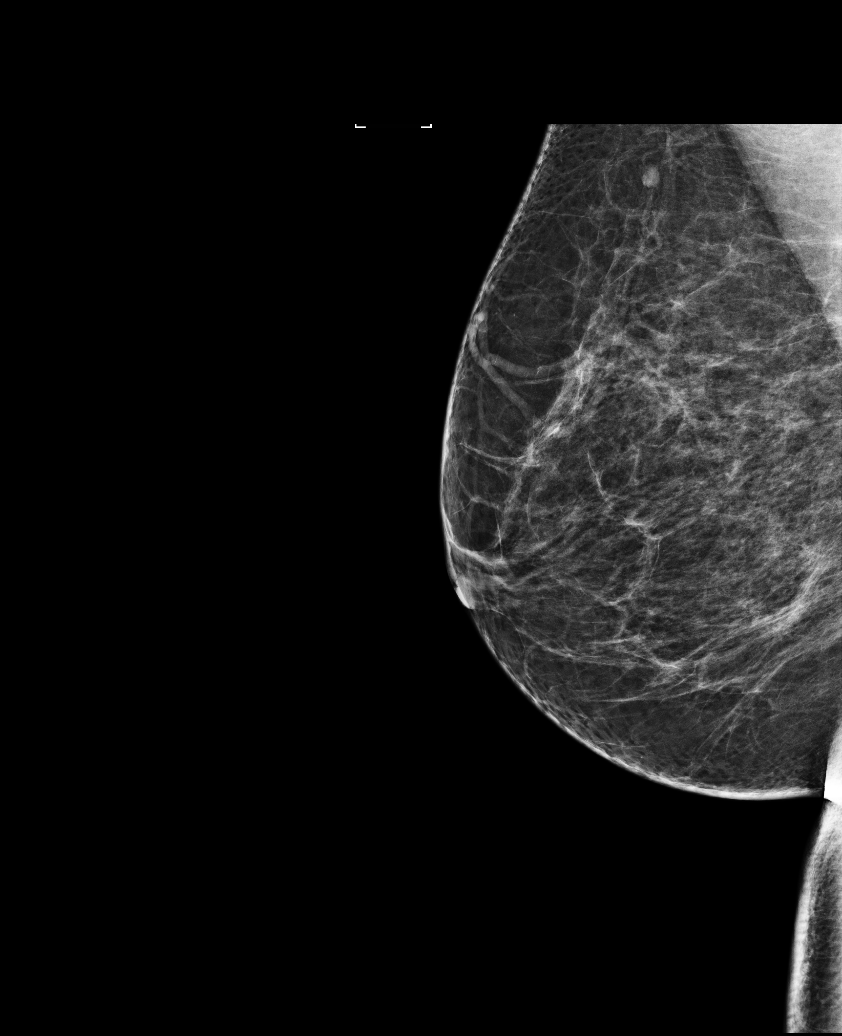

[L CC]
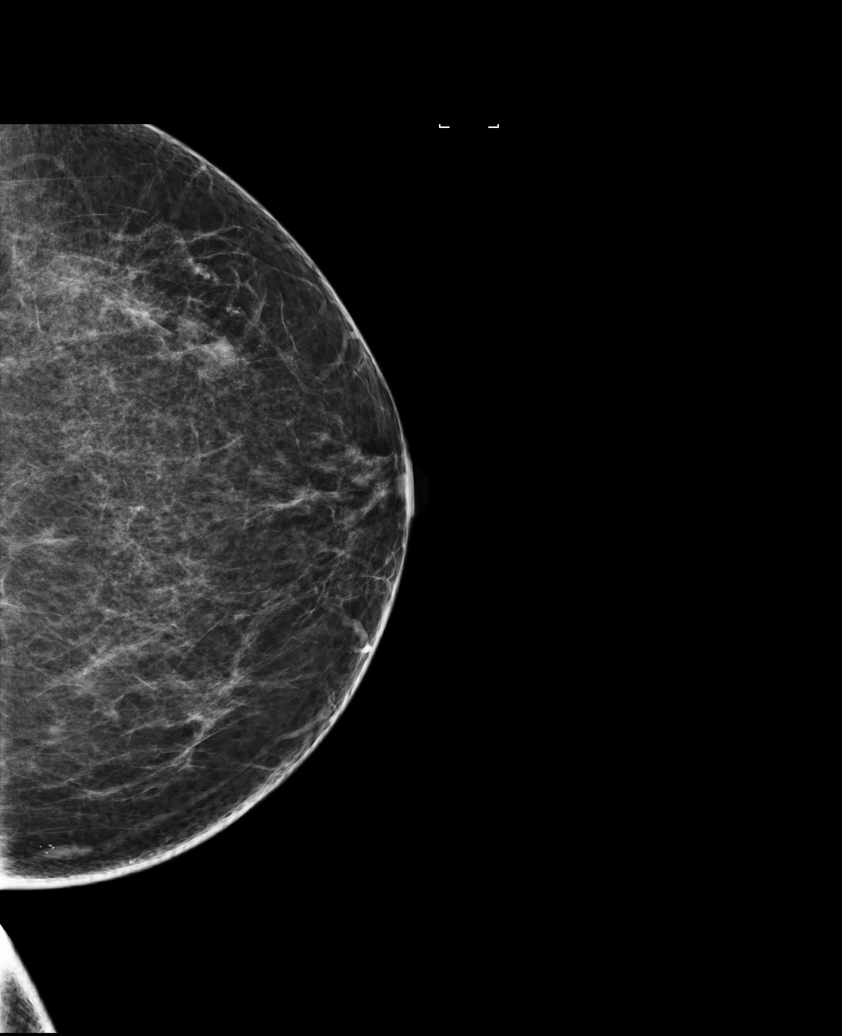

[R CC (2 of 2)]
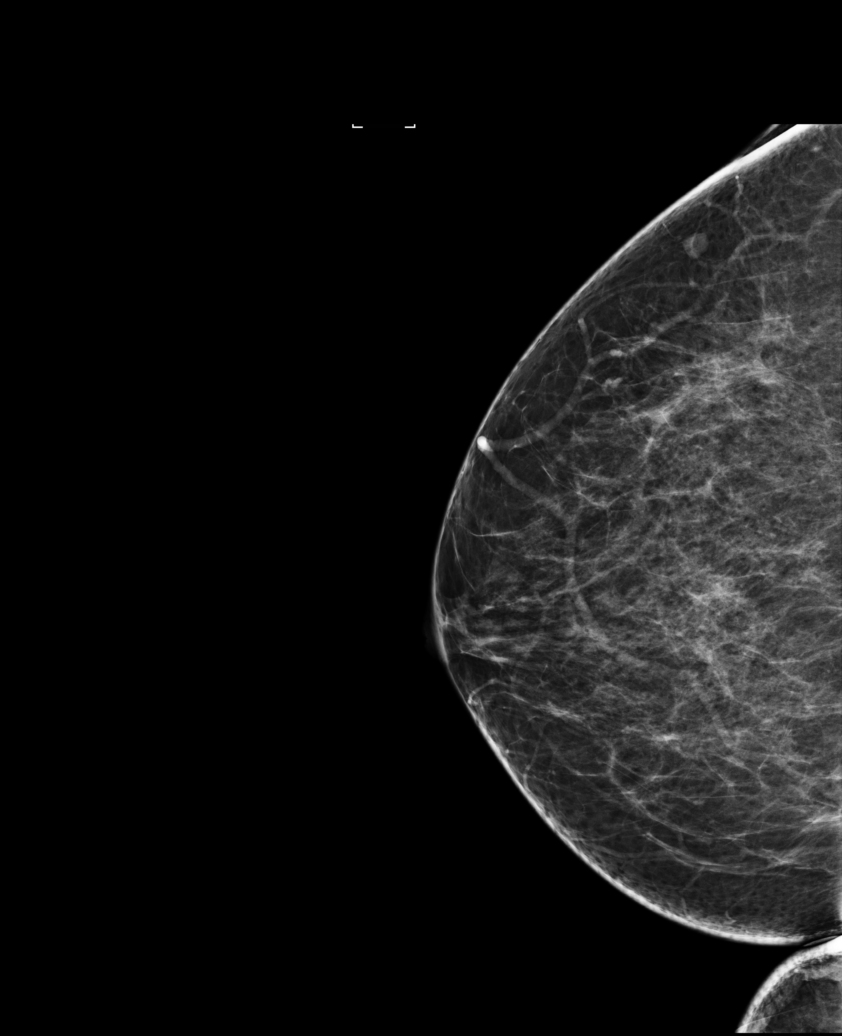

[5 of 5 positions shown; findings below may reference images not displayed]

ACR Breast Density Category b: There are scattered areas of
fibroglandular density.
FINDINGS: There are no findings suspicious for malignancy.
IMPRESSION: No mammographic evidence of malignancy. A result letter of this
screening mammogram will be mailed directly to the patient.

RECOMMENDATION:
Screening mammogram in one year. (Code:WO-V-ZRK)

BI-RADS CATEGORY  1: Negative.

## 2023-01-30 ENCOUNTER — Other Ambulatory Visit: Payer: Self-pay

## 2023-01-30 ENCOUNTER — Other Ambulatory Visit (HOSPITAL_BASED_OUTPATIENT_CLINIC_OR_DEPARTMENT_OTHER): Payer: Self-pay

## 2023-02-16 ENCOUNTER — Other Ambulatory Visit (HOSPITAL_BASED_OUTPATIENT_CLINIC_OR_DEPARTMENT_OTHER): Payer: Self-pay

## 2023-02-16 ENCOUNTER — Ambulatory Visit (INDEPENDENT_AMBULATORY_CARE_PROVIDER_SITE_OTHER): Payer: 59 | Admitting: Nurse Practitioner

## 2023-02-16 ENCOUNTER — Encounter: Payer: Self-pay | Admitting: Nurse Practitioner

## 2023-02-16 VITALS — BP 120/80 | HR 98 | Temp 98.0°F | Resp 16 | Ht 67.0 in | Wt 203.0 lb

## 2023-02-16 DIAGNOSIS — E559 Vitamin D deficiency, unspecified: Secondary | ICD-10-CM

## 2023-02-16 DIAGNOSIS — E039 Hypothyroidism, unspecified: Secondary | ICD-10-CM

## 2023-02-16 DIAGNOSIS — Z1211 Encounter for screening for malignant neoplasm of colon: Secondary | ICD-10-CM

## 2023-02-16 DIAGNOSIS — E1169 Type 2 diabetes mellitus with other specified complication: Secondary | ICD-10-CM

## 2023-02-16 DIAGNOSIS — M19011 Primary osteoarthritis, right shoulder: Secondary | ICD-10-CM

## 2023-02-16 DIAGNOSIS — I10 Essential (primary) hypertension: Secondary | ICD-10-CM

## 2023-02-16 DIAGNOSIS — E785 Hyperlipidemia, unspecified: Secondary | ICD-10-CM | POA: Diagnosis not present

## 2023-02-16 DIAGNOSIS — Z0001 Encounter for general adult medical examination with abnormal findings: Secondary | ICD-10-CM

## 2023-02-16 DIAGNOSIS — Z0000 Encounter for general adult medical examination without abnormal findings: Secondary | ICD-10-CM | POA: Diagnosis not present

## 2023-02-16 DIAGNOSIS — Z7984 Long term (current) use of oral hypoglycemic drugs: Secondary | ICD-10-CM

## 2023-02-16 MED ORDER — IBUPROFEN 800 MG PO TABS
800.0000 mg | ORAL_TABLET | Freq: Three times a day (TID) | ORAL | 0 refills | Status: DC | PRN
  Filled 2023-02-16: qty 30, 10d supply, fill #0

## 2023-02-16 NOTE — Assessment & Plan Note (Signed)
Repeat THYROID asymptomatic

## 2023-02-16 NOTE — Assessment & Plan Note (Addendum)
Associated with movement pain. Dg completed 08/2019: mild glenohumeral osteoarthritis. There is mild right AC joint osteoarthritis. Improved with ibuprofen 800mg  Qdprn Refill sent Provided home shoulder exercises

## 2023-02-16 NOTE — Patient Instructions (Addendum)
Schedule lab appt Continue Heart healthy diet and daily exercise. Maintain current medications. Systane complete eye lubricant  Shoulder Impingement Syndrome Rehab Ask your health care provider which exercises are safe for you. Do exercises exactly as told by your provider and adjust them as told. It is normal to feel mild stretching, pulling, tightness, or discomfort as you do these exercises. Stop right away if you feel sudden pain or your pain gets worse. Do not begin these exercises until told by your provider. Stretching and range-of-motion exercise This exercise warms up your muscles and joints and improves the movement and flexibility of your shoulder. This exercise also helps to relieve pain and stiffness. Passive horizontal adduction In passive adduction, you use your other hand to move the injured arm toward your body. The injured arm does not move on its own. In this movement, your arm is moved across your body in the horizontal plane (horizontal adduction). Sit or stand and pull your left / right elbow across your chest, toward your other shoulder. Stop when you feel a gentle stretch in the back of your shoulder and upper arm. Keep your arm at shoulder height. Keep your arm as close to your body as you comfortably can. Hold for __________ seconds. Slowly return to the starting position. Repeat __________ times. Complete this exercise __________ times a day. Strengthening exercises These exercises build strength and endurance in your shoulder. Endurance is the ability to use your muscles for a long time, even after they get tired. External rotation, isometric This is an exercise in which you press the back of your wrist against a doorframe without moving your shoulder joint (isometric). Stand or sit in a doorway, facing the door frame. Bend your left / right elbow and place the back of your wrist against the doorframe. Only the back of your wrist should be touching the frame. Keep  your upper arm at your side. Gently press your wrist against the doorframe, as if you are trying to push your arm away from your abdomen (external rotation). Press as hard as you are able without pain. Avoid shrugging your shoulder while you press your wrist against the doorframe. Keep your shoulder blade tucked down toward the middle of your back. Hold for __________ seconds. Slowly release the tension, and relax your muscles completely before you repeat the exercise. Repeat __________ times. Complete this exercise __________ times a day. Internal rotation, isometric This is an exercise in which you press your palm against a doorframe without moving your shoulder joint (isometric). Stand or sit in a doorway, facing the doorframe. Bend your left / right elbow and place the palm of your hand against the doorframe. Only your palm should be touching the frame. Keep your upper arm at your side. Gently press your hand against the doorframe, as if you are trying to push your arm toward your abdomen (internal rotation). Press as hard as you are able without pain. Avoid shrugging your shoulder while you press your hand against the doorframe. Keep your shoulder blade tucked down toward the middle of your back. Hold for __________ seconds. Slowly release the tension, and relax your muscles completely before you repeat the exercise. Repeat __________ times. Complete this exercise __________ times a day. Scapular protraction, supine, isotonic  Lie on your back on a firm surface (supine position). Hold a __________ weight in your left / right hand. Raise your left / right arm straight into the air so your hand is directly above your shoulder joint. Push  the weight into the air so your shoulder (scapula) lifts off the surface that you are lying on. The scapula will push up or forward (protraction). Do not move your head, neck, or back. Hold for __________ seconds. Slowly return to the starting position. Let  your muscles relax completely before you repeat this exercise. Repeat __________ times. Complete this exercise __________ times a day. Scapular retraction, isotonic  Sit in a stable chair without armrests or stand up. Secure an exercise band to a stable object in front of you so the band is at shoulder height. Hold one end of the exercise band in each hand. Squeeze your shoulder blades together (retraction) and move your elbows slightly behind you. Do not shrug your shoulders upward while you do this. Hold for __________ seconds. Slowly return to the starting position. Repeat __________ times. Complete this exercise __________ times a day. Shoulder extension, isotonic  Sit in a stable chair without armrests or stand up. Secure an exercise band to a stable object in front of you so the band is above shoulder height. Hold one end of the exercise band in each hand. Straighten your elbows and lift your hands up to shoulder height. Squeeze your shoulder blades together and pull your hands down to the sides of your thighs (extension). Stop when your hands are straight down by your sides. Do not let your hands go behind your body. Hold for __________ seconds. Slowly return to the starting position. Repeat __________ times. Complete this exercise __________ times a day. This information is not intended to replace advice given to you by your health care provider. Make sure you discuss any questions you have with your health care provider. Document Revised: 04/06/2022 Document Reviewed: 04/06/2022 Elsevier Patient Education  2024 [employer].

## 2023-02-16 NOTE — Progress Notes (Signed)
Complete physical exam  Patient: Cheryl Welch   DOB: 12-Sep-1975   47 y.o. Female  MRN: [id number] Visit Date: 02/16/2023  Subjective:    Chief Complaint  Patient presents with   Annual Exam    Nonf asting Eval of chronic conditions   Cheryl Welch is a 47 y.o. female who presents today for a complete physical exam. She reports consuming a low fat and low sodium diet.  Walking daily and weight training  She generally feels well. She reports sleeping well. She does not have additional problems to discuss today.  Vision:Yes Dental:Yes STD Screen:No  BP Readings from Last 3 Encounters:  02/16/23 120/80  11/20/22 120/86  08/17/22 120/80   Wt Readings from Last 3 Encounters:  02/16/23 203 lb (92.1 kg)  11/20/22 198 lb 12.8 oz (90.2 kg)  08/17/22 202 lb 6.4 oz (91.8 kg)   Most recent fall risk assessment:    11/20/2022    9:38 AM  Fall Risk   Falls in the past year? 0  Number falls in past yr: 0  Injury with Fall? 0  Risk for fall due to : No Fall Risks  Follow up Falls evaluation completed   Depression screen:Yes - No Depression  Most recent depression screenings:    02/16/2023    8:13 AM 08/01/2022    9:49 AM  PHQ 2/9 Scores  PHQ - 2 Score 0 0   HPI  DM (diabetes mellitus) (HCC) No glucose check at home Current use of metformin Normal foot exam Normal UACr  Repeat hgbA1c, LDL and CMP Maintain metformin dose F/up in 3months  HTN (hypertension) BP at goal with amlodipine and spironolactone BP Readings from Last 3 Encounters:  02/16/23 120/80  11/20/22 120/86  08/17/22 120/80    Maintain med doses Repeat CMP F/up in 3months  Hyperlipidemia associated with type 2 diabetes mellitus (HCC) Repeat lipid panel  Hypothyroidism Repeat THYROID asymptomatic  Glenohumeral arthritis, right Associated with movement pain. Dg completed 08/2019: mild glenohumeral osteoarthritis. There is mild right AC joint osteoarthritis. Improved with ibuprofen 800mg   Qdprn Refill sent Provided home shoulder exercises  Past Medical History:  Diagnosis Date   Abnormal Pap smear    2004; LGSIL??>colpo>nml   Abnormal thyroid function test    Allergy    Arthritis    Diabetes mellitus without complication (HCC)    DM (diabetes mellitus) (HCC) 10/17/2017   Encounter for refill of prescription for contraception 08/01/2022   GERD (gastroesophageal reflux disease)    Herpes simplex 09/03/2015   Hirsutism    HSV infection    HTN (hypertension) 07/05/2021   Hyperlipidemia associated with type 2 diabetes mellitus (HCC) 08/17/2022   Hypothyroidism 07/20/2015   PCO (polycystic ovaries)    Prediabetes    Past Surgical History:  Procedure Laterality Date   CESAREAN SECTION  07/24/1992   Social History   Socioeconomic History   Marital status: Married    Spouse name: Not on file   Number of children: 2   Years of education: Not on file   Highest education level: Not on file  Occupational History   Occupation: Producer, television/film/video: [employer]  Tobacco Use   Smoking status: Never   Smokeless tobacco: Never  Vaping Use   Vaping status: Never Used  Substance and Sexual Activity   Alcohol use: Yes    Comment: occassionally   Drug use: No   Sexual activity: Yes    Partners: Male    Birth control/protection:  Pill  Other Topics Concern   Not on file  Social History Narrative   Not on file   Social Determinants of Health   Financial Resource Strain: Not on file  Food Insecurity: Not on file  Transportation Needs: Not on file  Physical Activity: Not on file  Stress: Not on file  Social Connections: Not on file  Intimate Partner Violence: Not on file   Family Status  Relation Name Status   Mother  (Not Specified)   Father  (Not Specified)   MGM grandmother (Not Specified)   Neg Hx  (Not Specified)  No partnership data on file   Family History  Problem Relation Age of Onset   Hypertension Mother    Cancer Father        kidney    Hypertension Father    Colon polyps Father    Other Father        esophageal stricture   Diabetes Maternal Grandmother    Colon cancer Neg Hx    Esophageal cancer Neg Hx    Breast cancer Neg Hx    No Known Allergies  Patient Care Team: Neilah Fulwider, Bonna Gains, NP as PCP - General (Internal Medicine)   Medications: Outpatient Medications Prior to Visit  Medication Sig   amLODipine (NORVASC) 5 MG tablet Take 1 tablet (5 mg total) by mouth daily.   levonorgestrel-ethinyl estradiol (SEASONALE) 0.15-0.03 MG tablet Take 1 tablet by mouth daily.   metFORMIN (GLUCOPHAGE-XR) 750 MG 24 hr tablet Take 1 tablet (750 mg total) by mouth daily with breakfast.   pantoprazole (PROTONIX) 20 MG tablet Take 1 tablet (20 mg total) by mouth daily.   spironolactone (ALDACTONE) 100 MG tablet Take 1 tablet (100 mg total) by mouth 2 (two) times daily.   valACYclovir (VALTREX) 500 MG tablet Take 500 mg by mouth 2 (two) times daily.   [DISCONTINUED] ibuprofen (ADVIL) 800 MG tablet Take 1 tablet (800 mg total) by mouth every 8 (eight) hours as needed (with food).   cyclobenzaprine (FLEXERIL) 5 MG tablet Take 1 tablet (5 mg total) by mouth 2 (two) times daily. (Patient not taking: Reported on 02/16/2023)   No facility-administered medications prior to visit.    Review of Systems      Objective:  BP 120/80 (BP Location: Left Arm, Patient Position: Sitting, Cuff Size: Large)   Pulse 98   Temp 98 F (36.7 C) (Temporal)   Resp 16   Ht 5\' 7"  (1.702 m)   Wt 203 lb (92.1 kg)   SpO2 99%   BMI 31.79 kg/m     Physical Exam Vitals and nursing note reviewed.  Constitutional:      General: She is not in acute distress. HENT:     Right Ear: Tympanic membrane, ear canal and external ear normal.     Left Ear: Tympanic membrane, ear canal and external ear normal.     Nose: Nose normal.  Eyes:     Extraocular Movements: Extraocular movements intact.     Conjunctiva/sclera: Conjunctivae normal.     Pupils: Pupils  are equal, round, and reactive to light.  Neck:     Thyroid: No thyroid mass, thyromegaly or thyroid tenderness.  Cardiovascular:     Rate and Rhythm: Normal rate and regular rhythm.     Pulses: Normal pulses.     Heart sounds: Normal heart sounds.  Pulmonary:     Effort: Pulmonary effort is normal.     Breath sounds: Normal breath sounds.  Abdominal:  General: Bowel sounds are normal.     Palpations: Abdomen is soft.  Musculoskeletal:        General: Normal range of motion.     Cervical back: Normal range of motion and neck supple.     Right lower leg: No edema.     Left lower leg: No edema.  Lymphadenopathy:     Cervical: No cervical adenopathy.  Skin:    General: Skin is warm and dry.  Neurological:     Mental Status: She is alert and oriented to person, place, and time.     Cranial Nerves: No cranial nerve deficit.  Psychiatric:        Mood and Affect: Mood normal.        Behavior: Behavior normal.        Thought Content: Thought content normal.      No results found for any visits on 02/16/23.    Assessment & Plan:    Routine Health Maintenance and Physical Exam  Immunization History  Administered Date(s) Administered   Influenza,inj,Quad PF,6-35 Mos 04/30/2013   Influenza-Unspecified 04/25/2019, 05/05/2020, 05/08/2021, 05/07/2022   PFIZER(Purple Top)SARS-COV-2 Vaccination 10/03/2019, 10/24/2019   Tdap 03/27/2007    Health Maintenance  Topic Date Due   COVID-19 Vaccine (3 - Pfizer risk series) 11/21/2019   Colonoscopy  Never done   Hepatitis C Screening  02/16/2024 (Originally 04/05/1994)   HIV Screening  02/16/2024 (Originally 04/06/1991)   INFLUENZA VACCINE  02/22/2023   HEMOGLOBIN A1C  05/22/2023   OPHTHALMOLOGY EXAM  06/24/2023   Diabetic kidney evaluation - eGFR measurement  08/18/2023   Diabetic kidney evaluation - Urine ACR  08/18/2023   PAP SMEAR-Modifier  11/02/2023   FOOT EXAM  11/20/2023   HPV VACCINES  Aged Out   DTaP/Tdap/Td  Discontinued     Discussed health benefits of physical activity, and encouraged her to engage in regular exercise appropriate for her age and condition.  Problem List Items Addressed This Visit       Cardiovascular and Mediastinum   HTN (hypertension)    BP at goal with amlodipine and spironolactone BP Readings from Last 3 Encounters:  02/16/23 120/80  11/20/22 120/86  08/17/22 120/80    Maintain med doses Repeat CMP F/up in 3months        Endocrine   DM (diabetes mellitus) (HCC)    No glucose check at home Current use of metformin Normal foot exam Normal UACr  Repeat hgbA1c, LDL and CMP Maintain metformin dose F/up in 3months      Relevant Orders   Hemoglobin A1c   Hyperlipidemia associated with type 2 diabetes mellitus (HCC)    Repeat lipid panel      Relevant Orders   Lipid panel   Hypothyroidism    Repeat THYROID asymptomatic      Relevant Orders   TSH     Musculoskeletal and Integument   Glenohumeral arthritis, right    Associated with movement pain. Dg completed 08/2019: mild glenohumeral osteoarthritis. There is mild right AC joint osteoarthritis. Improved with ibuprofen 800mg  Qdprn Refill sent Provided home shoulder exercises      Relevant Medications   ibuprofen (ADVIL) 800 MG tablet     Other   Vitamin D deficiency   Relevant Orders   VITAMIN D 25 Hydroxy (Vit-D Deficiency, Fractures)   Other Visit Diagnoses     Encounter for preventative adult health care exam with abnormal findings    -  Primary   Relevant Orders   Comprehensive metabolic panel  Cologuard   Colon cancer screening       Relevant Orders   Cologuard      Return in about 6 months (around 08/19/2023) for HTN, DM, hyperlipidemia (fasting).     Alysia Penna, NP

## 2023-02-16 NOTE — Assessment & Plan Note (Signed)
Repeat lipid panel ?

## 2023-02-16 NOTE — Assessment & Plan Note (Signed)
BP at goal with amlodipine and spironolactone BP Readings from Last 3 Encounters:  02/16/23 120/80  11/20/22 120/86  08/17/22 120/80    Maintain med doses Repeat CMP F/up in 3months

## 2023-02-16 NOTE — Assessment & Plan Note (Signed)
No glucose check at home Current use of metformin Normal foot exam Normal UACr  Repeat hgbA1c, LDL and CMP Maintain metformin dose F/up in 3months

## 2023-02-19 ENCOUNTER — Other Ambulatory Visit (INDEPENDENT_AMBULATORY_CARE_PROVIDER_SITE_OTHER): Payer: 59

## 2023-02-19 DIAGNOSIS — E559 Vitamin D deficiency, unspecified: Secondary | ICD-10-CM | POA: Diagnosis not present

## 2023-02-19 DIAGNOSIS — Z0001 Encounter for general adult medical examination with abnormal findings: Secondary | ICD-10-CM

## 2023-02-19 DIAGNOSIS — E039 Hypothyroidism, unspecified: Secondary | ICD-10-CM

## 2023-02-19 DIAGNOSIS — E785 Hyperlipidemia, unspecified: Secondary | ICD-10-CM

## 2023-02-19 DIAGNOSIS — E1169 Type 2 diabetes mellitus with other specified complication: Secondary | ICD-10-CM | POA: Diagnosis not present

## 2023-02-19 LAB — LIPID PANEL
Cholesterol: 179 mg/dL (ref 0–200)
HDL: 54.7 mg/dL (ref >39.00)
LDL Cholesterol: 98 mg/dL (ref 0–99)
NonHDL: 124.24
Total CHOL/HDL Ratio: 3
Triglycerides: 133 mg/dL (ref 0.0–149.0)
VLDL: 26.6 mg/dL (ref 0.0–40.0)

## 2023-02-19 LAB — COMPREHENSIVE METABOLIC PANEL
ALT: 9 U/L (ref 0–35)
AST: 9 U/L (ref 0–37)
Albumin: 3.7 g/dL (ref 3.5–5.2)
Alkaline Phosphatase: 209 U/L — ABNORMAL HIGH (ref 39–117)
BUN: 7 mg/dL (ref 6–23)
CO2: 25 mEq/L (ref 19–32)
Calcium: 8.8 mg/dL (ref 8.4–10.5)
Chloride: 97 mEq/L (ref 96–112)
Creatinine, Ser: 0.65 mg/dL (ref 0.40–1.20)
GFR: 105.25 mL/min (ref >60.00)
Glucose, Bld: 144 mg/dL — ABNORMAL HIGH (ref 70–99)
Potassium: 3.8 mEq/L (ref 3.5–5.1)
Sodium: 134 mEq/L — ABNORMAL LOW (ref 135–145)
Total Bilirubin: 0.3 mg/dL (ref 0.2–1.2)
Total Protein: 6.7 g/dL (ref 6.0–8.3)

## 2023-02-19 LAB — HEMOGLOBIN A1C: Hgb A1c MFr Bld: 8.7 % — ABNORMAL HIGH (ref 4.6–6.5)

## 2023-02-19 LAB — TSH: TSH: 2.73 u[IU]/mL (ref 0.35–5.50)

## 2023-02-19 LAB — VITAMIN D 25 HYDROXY (VIT D DEFICIENCY, FRACTURES): VITD: 15.9 ng/mL — ABNORMAL LOW (ref 30.00–100.00)

## 2023-02-20 ENCOUNTER — Telehealth: Payer: Self-pay | Admitting: Nurse Practitioner

## 2023-02-20 ENCOUNTER — Other Ambulatory Visit (HOSPITAL_BASED_OUTPATIENT_CLINIC_OR_DEPARTMENT_OTHER): Payer: Self-pay

## 2023-02-20 DIAGNOSIS — E1169 Type 2 diabetes mellitus with other specified complication: Secondary | ICD-10-CM

## 2023-02-20 MED ORDER — OZEMPIC (0.25 OR 0.5 MG/DOSE) 2 MG/3ML ~~LOC~~ SOPN
PEN_INJECTOR | SUBCUTANEOUS | 2 refills | Status: DC
Start: 2023-02-20 — End: 2023-05-18
  Filled 2023-02-20: qty 3, fill #0
  Filled 2023-02-22: qty 3, 30d supply, fill #0
  Filled 2023-03-30: qty 3, 28d supply, fill #1
  Filled 2023-04-29: qty 3, 28d supply, fill #2

## 2023-02-20 NOTE — Telephone Encounter (Signed)
02/20/23 - Pt called asking that she be called back per Lab results at [phone] .

## 2023-02-20 NOTE — Telephone Encounter (Signed)
Cheryl Welch has thought about and has decided to start on Ozempic instead of raising the dosage of the pill. You can call Ozempic in.

## 2023-02-20 NOTE — Telephone Encounter (Signed)
Called back and wanted to know if she can increase her metformin before trying [facility].  Per Claris Gower this was ok to do and she should return back in 3 months.

## 2023-02-20 NOTE — Addendum Note (Signed)
Addended by: Michaela Corner on: 02/20/2023 04:16 PM   Modules accepted: Orders

## 2023-02-22 ENCOUNTER — Other Ambulatory Visit (HOSPITAL_BASED_OUTPATIENT_CLINIC_OR_DEPARTMENT_OTHER): Payer: Self-pay

## 2023-02-24 DIAGNOSIS — Z1211 Encounter for screening for malignant neoplasm of colon: Secondary | ICD-10-CM | POA: Diagnosis not present

## 2023-02-26 ENCOUNTER — Telehealth: Payer: Self-pay | Admitting: Nurse Practitioner

## 2023-02-26 ENCOUNTER — Other Ambulatory Visit: Payer: Self-pay

## 2023-02-26 DIAGNOSIS — E1169 Type 2 diabetes mellitus with other specified complication: Secondary | ICD-10-CM

## 2023-02-26 NOTE — Telephone Encounter (Signed)
Caller Name: Asami, pt Call back phone #: [phone]  Reason for Call: Pt called to ask if she is supposed to take 0.25mg  once a week for the first 2 weeks (total of 2 doses) or if different. She was concerned with how directions were written.   Inject 0.25 mg into the skin once a week for 14 days, THEN 0.5 mg once a week continuosly.

## 2023-02-26 NOTE — Telephone Encounter (Signed)
Called Cheryl Welch back and explained how the instructions given by Claris Gower for the Ozempic.

## 2023-03-30 ENCOUNTER — Other Ambulatory Visit (HOSPITAL_BASED_OUTPATIENT_CLINIC_OR_DEPARTMENT_OTHER): Payer: Self-pay

## 2023-04-30 ENCOUNTER — Other Ambulatory Visit: Payer: Self-pay

## 2023-04-30 ENCOUNTER — Other Ambulatory Visit (HOSPITAL_BASED_OUTPATIENT_CLINIC_OR_DEPARTMENT_OTHER): Payer: Self-pay

## 2023-05-09 ENCOUNTER — Other Ambulatory Visit: Payer: Self-pay | Admitting: Nurse Practitioner

## 2023-05-09 DIAGNOSIS — Z1231 Encounter for screening mammogram for malignant neoplasm of breast: Secondary | ICD-10-CM

## 2023-05-18 ENCOUNTER — Ambulatory Visit
Admission: RE | Admit: 2023-05-18 | Discharge: 2023-05-18 | Disposition: A | Discharge status: Home / Self Care | Payer: 59 | Source: Ambulatory Visit

## 2023-05-18 ENCOUNTER — Ambulatory Visit: Payer: 59 | Admitting: Nurse Practitioner

## 2023-05-18 ENCOUNTER — Ambulatory Visit (INDEPENDENT_AMBULATORY_CARE_PROVIDER_SITE_OTHER): Payer: 59 | Admitting: Nurse Practitioner

## 2023-05-18 ENCOUNTER — Other Ambulatory Visit (HOSPITAL_BASED_OUTPATIENT_CLINIC_OR_DEPARTMENT_OTHER): Payer: Self-pay

## 2023-05-18 VITALS — BP 124/76 | HR 96 | Temp 97.6°F | Resp 18 | Ht 67.32 in | Wt 182.6 lb

## 2023-05-18 DIAGNOSIS — I10 Essential (primary) hypertension: Secondary | ICD-10-CM | POA: Diagnosis not present

## 2023-05-18 DIAGNOSIS — E785 Hyperlipidemia, unspecified: Secondary | ICD-10-CM

## 2023-05-18 DIAGNOSIS — B009 Herpesviral infection, unspecified: Secondary | ICD-10-CM | POA: Diagnosis not present

## 2023-05-18 DIAGNOSIS — Z7985 Long-term (current) use of injectable non-insulin antidiabetic drugs: Secondary | ICD-10-CM

## 2023-05-18 DIAGNOSIS — M19011 Primary osteoarthritis, right shoulder: Secondary | ICD-10-CM

## 2023-05-18 DIAGNOSIS — M5442 Lumbago with sciatica, left side: Secondary | ICD-10-CM | POA: Diagnosis not present

## 2023-05-18 DIAGNOSIS — R748 Abnormal levels of other serum enzymes: Secondary | ICD-10-CM

## 2023-05-18 DIAGNOSIS — E663 Overweight: Secondary | ICD-10-CM | POA: Diagnosis not present

## 2023-05-18 DIAGNOSIS — Z1231 Encounter for screening mammogram for malignant neoplasm of breast: Secondary | ICD-10-CM | POA: Diagnosis not present

## 2023-05-18 DIAGNOSIS — M7542 Impingement syndrome of left shoulder: Secondary | ICD-10-CM | POA: Insufficient documentation

## 2023-05-18 DIAGNOSIS — Z7984 Long term (current) use of oral hypoglycemic drugs: Secondary | ICD-10-CM

## 2023-05-18 DIAGNOSIS — E1169 Type 2 diabetes mellitus with other specified complication: Secondary | ICD-10-CM | POA: Diagnosis not present

## 2023-05-18 DIAGNOSIS — E559 Vitamin D deficiency, unspecified: Secondary | ICD-10-CM | POA: Diagnosis not present

## 2023-05-18 MED ORDER — CYCLOBENZAPRINE HCL 5 MG PO TABS
5.0000 mg | ORAL_TABLET | Freq: Every day | ORAL | 0 refills | Status: DC
Start: 2023-05-18 — End: 2023-09-24
  Filled 2023-05-18: qty 14, 7d supply, fill #0

## 2023-05-18 MED ORDER — VITAMIN D (ERGOCALCIFEROL) 1.25 MG (50000 UNIT) PO CAPS
50000.0000 [IU] | ORAL_CAPSULE | ORAL | 0 refills | Status: DC
Start: 2023-05-18 — End: 2023-09-24
  Filled 2023-05-18: qty 12, 84d supply, fill #0

## 2023-05-18 MED ORDER — VALACYCLOVIR HCL 500 MG PO TABS
500.0000 mg | ORAL_TABLET | Freq: Two times a day (BID) | ORAL | 0 refills | Status: DC
  Filled 2023-05-18: qty 14, 7d supply, fill #0

## 2023-05-18 MED ORDER — PREDNISONE 20 MG PO TABS
40.0000 mg | ORAL_TABLET | Freq: Every day | ORAL | 0 refills | Status: DC
Start: 2023-05-18 — End: 2023-09-24
  Filled 2023-05-18: qty 6, 3d supply, fill #0

## 2023-05-18 MED ORDER — OZEMPIC (0.25 OR 0.5 MG/DOSE) 2 MG/3ML ~~LOC~~ SOPN
0.5000 mg | PEN_INJECTOR | SUBCUTANEOUS | 1 refills | Status: DC
  Filled 2023-05-18: qty 6, fill #0
  Filled 2023-05-27: qty 6, 56d supply, fill #0
  Filled 2023-07-22: qty 6, 56d supply, fill #1

## 2023-05-18 NOTE — Assessment & Plan Note (Signed)
Start 50000IU weekly x 12weeks, then start OVER THE COUNTER dose 2000Iu daily

## 2023-05-18 NOTE — Patient Instructions (Addendum)
Hold ibuprofen while taking oral prednisone Ok to use tylenol 650mg  every 8hrs as needed Start vit D 50000Iu weekly x 12weeks, then switch to OVER THE COUNTER dose 2000IU daily Maintain other med doses Go to lab

## 2023-05-18 NOTE — Assessment & Plan Note (Signed)
Lost 21lbs in last 3months with use of ozempic 0.5mg  Has also maintain daily exercise: walking Struggles with adequate protein intake due to suppressed appetite. Wt Readings from Last 3 Encounters:  05/18/23 182 lb 9.6 oz (82.8 kg)  02/16/23 203 lb (92.1 kg)  11/20/22 198 lb 12.8 oz (90.2 kg)    Maintain med dose Provided information about ways to increase daily protein and fiber. Advised to maintain at least 2 balanced diet daily.

## 2023-05-18 NOTE — Assessment & Plan Note (Signed)
Repeat direct LDL

## 2023-05-18 NOTE — Assessment & Plan Note (Signed)
BP at goal with amlodipine and spironolactone BP Readings from Last 3 Encounters:  05/18/23 124/76  02/16/23 120/80  11/20/22 120/86    Maintain med doses F/up in 3months

## 2023-05-18 NOTE — Assessment & Plan Note (Signed)
Minimal improvement with ibuprofen 800mg  QD Associated with arm muscle weakness No paresthesia. ROM is not limited, no swelling.  Sent oral prednisone and referred for outpatient PT

## 2023-05-18 NOTE — Assessment & Plan Note (Signed)
Reports mild constipation and struggles with maintaining adequate daily protein intake due to current use of  ozempic 0.5mg  weekly She held metformin because was not sure if to take with ozempic. BP at goal No DM retinopathy or neuropathy.  Repeat hgbA1c and CMP Maintain ozempic dose Will resume metformin if hgbA1c>7% Advised to maintain adequate oral hydration, high protein/high fiber diet. F/up in 3months

## 2023-05-18 NOTE — Assessment & Plan Note (Signed)
Onset 2weeks ago No injury, no weakness, no paresthesia Worse in supine positive and prolonged sitting. Minimal improvement with ibuprofen 800mg  every day  Sent flexeril and oral prednisone Advised to hold NSAID while taking oral prednisone Advised to also take tylenol 650mg  BID Entered ref to PT

## 2023-05-18 NOTE — Progress Notes (Signed)
Established Patient Visit  Patient: Cheryl Welch   DOB: Feb 27, 1976   47 y.o. Female  MRN: [id number] Visit Date: 05/18/2023  Subjective:    Chief Complaint  Patient presents with   office visit     PT is here for medication management and RX refills on Ozempic and Valtrex. Patient did cologuard .   HPI HTN (hypertension) BP at goal with amlodipine and spironolactone BP Readings from Last 3 Encounters:  05/18/23 124/76  02/16/23 120/80  11/20/22 120/86    Maintain med doses F/up in 3months  Hyperlipidemia associated with type 2 diabetes mellitus (HCC) Repeat direct LDL  DM (diabetes mellitus) (HCC) Reports mild constipation and struggles with maintaining adequate daily protein intake due to current use of  ozempic 0.5mg  weekly She held metformin because was not sure if to take with ozempic. BP at goal No DM retinopathy or neuropathy.  Repeat hgbA1c and CMP Maintain ozempic dose Will resume metformin if hgbA1c>7% Advised to maintain adequate oral hydration, high protein/high fiber diet. F/up in 3months   Glenohumeral arthritis, right Minimal improvement with ibuprofen 800mg  QD Associated with arm muscle weakness No paresthesia. ROM is not limited, no swelling.  Sent oral prednisone and referred for outpatient PT   Vitamin D deficiency Start 50000IU weekly x 12weeks, then start OVER THE COUNTER dose 2000Iu daily  Herpes labialis Valtrex sent  Acute left-sided low back pain with left-sided sciatica Onset 2weeks ago No injury, no weakness, no paresthesia Worse in supine positive and prolonged sitting. Minimal improvement with ibuprofen 800mg  every day  Sent flexeril and oral prednisone Advised to hold NSAID while taking oral prednisone Advised to also take tylenol 650mg  BID Entered ref to PT  Overweight (BMI 25.0-29.9) Lost 21lbs in last 3months with use of ozempic 0.5mg  Has also maintain daily exercise: walking Struggles with adequate  protein intake due to suppressed appetite. Wt Readings from Last 3 Encounters:  05/18/23 182 lb 9.6 oz (82.8 kg)  02/16/23 203 lb (92.1 kg)  11/20/22 198 lb 12.8 oz (90.2 kg)    Maintain med dose Provided information about ways to increase daily protein and fiber. Advised to maintain at least 2 balanced diet daily.    Reviewed medical, surgical, and social history today  Medications: Outpatient Medications Prior to Visit  Medication Sig   amLODipine (NORVASC) 5 MG tablet Take 1 tablet (5 mg total) by mouth daily.   levonorgestrel-ethinyl estradiol (SEASONALE) 0.15-0.03 MG tablet Take 1 tablet by mouth daily.   metFORMIN (GLUCOPHAGE-XR) 750 MG 24 hr tablet Take 1 tablet (750 mg total) by mouth daily with breakfast.   pantoprazole (PROTONIX) 20 MG tablet Take 1 tablet (20 mg total) by mouth daily.   spironolactone (ALDACTONE) 100 MG tablet Take 1 tablet (100 mg total) by mouth 2 (two) times daily.   [DISCONTINUED] cyclobenzaprine (FLEXERIL) 5 MG tablet Take 1 tablet (5 mg total) by mouth 2 (two) times daily.   [DISCONTINUED] ibuprofen (ADVIL) 800 MG tablet Take 1 tablet (800 mg total) by mouth every 8 (eight) hours as needed (with food).   [DISCONTINUED] Semaglutide,0.25 or 0.5MG /DOS, (OZEMPIC, 0.25 OR 0.5 MG/DOSE,) 2 MG/3ML SOPN Inject 0.25 mg into the skin once a week for 14 days, THEN 0.5 mg once a week continuosly.   [DISCONTINUED] valACYclovir (VALTREX) 500 MG tablet Take 500 mg by mouth 2 (two) times daily.   No facility-administered medications prior to visit.   Reviewed past medical  and social history.   ROS per HPI above      Objective:  BP 124/76 (BP Location: Left Arm, Patient Position: Sitting, Cuff Size: Normal) Comment: recheck BP manual  Pulse 96 Comment: recheck after resting  Temp 97.6 F (36.4 C) (Temporal)   Resp 18   Ht 5' 7.32" (1.71 m)   Wt 182 lb 9.6 oz (82.8 kg)   SpO2 100%   BMI 28.33 kg/m      Physical Exam  No results found for any visits on  05/18/23.    Assessment & Plan:    Problem List Items Addressed This Visit     Acute left-sided low back pain with left-sided sciatica    Onset 2weeks ago No injury, no weakness, no paresthesia Worse in supine positive and prolonged sitting. Minimal improvement with ibuprofen 800mg  every day  Sent flexeril and oral prednisone Advised to hold NSAID while taking oral prednisone Advised to also take tylenol 650mg  BID Entered ref to PT      Relevant Medications   predniSONE (DELTASONE) 20 MG tablet   cyclobenzaprine (FLEXERIL) 5 MG tablet   Other Relevant Orders   Ambulatory referral to Physical Therapy   DM (diabetes mellitus) (HCC)    Reports mild constipation and struggles with maintaining adequate daily protein intake due to current use of  ozempic 0.5mg  weekly She held metformin because was not sure if to take with ozempic. BP at goal No DM retinopathy or neuropathy.  Repeat hgbA1c and CMP Maintain ozempic dose Will resume metformin if hgbA1c>7% Advised to maintain adequate oral hydration, high protein/high fiber diet. F/up in 3months       Relevant Medications   Semaglutide,0.25 or 0.5MG /DOS, (OZEMPIC, 0.25 OR 0.5 MG/DOSE,) 2 MG/3ML SOPN   Other Relevant Orders   Comprehensive metabolic panel   Hemoglobin A1c   Glenohumeral arthritis, right    Minimal improvement with ibuprofen 800mg  QD Associated with arm muscle weakness No paresthesia. ROM is not limited, no swelling.  Sent oral prednisone and referred for outpatient PT       Relevant Medications   predniSONE (DELTASONE) 20 MG tablet   cyclobenzaprine (FLEXERIL) 5 MG tablet   Other Relevant Orders   Ambulatory referral to Physical Therapy   Herpes labialis    Valtrex sent      Relevant Medications   valACYclovir (VALTREX) 500 MG tablet   HTN (hypertension) - Primary    BP at goal with amlodipine and spironolactone BP Readings from Last 3 Encounters:  05/18/23 124/76  02/16/23 120/80  11/20/22  120/86    Maintain med doses F/up in 3months      Relevant Orders   Comprehensive metabolic panel   Hyperlipidemia associated with type 2 diabetes mellitus (HCC)    Repeat direct LDL      Relevant Medications   Semaglutide,0.25 or 0.5MG /DOS, (OZEMPIC, 0.25 OR 0.5 MG/DOSE,) 2 MG/3ML SOPN   Other Relevant Orders   Comprehensive metabolic panel   Direct LDL   Overweight (BMI 25.0-29.9)    Lost 21lbs in last 3months with use of ozempic 0.5mg  Has also maintain daily exercise: walking Struggles with adequate protein intake due to suppressed appetite. Wt Readings from Last 3 Encounters:  05/18/23 182 lb 9.6 oz (82.8 kg)  02/16/23 203 lb (92.1 kg)  11/20/22 198 lb 12.8 oz (90.2 kg)    Maintain med dose Provided information about ways to increase daily protein and fiber. Advised to maintain at least 2 balanced diet daily.  Vitamin D deficiency    Start 50000IU weekly x 12weeks, then start OVER THE COUNTER dose 2000Iu daily      Relevant Medications   Vitamin D, Ergocalciferol, (DRISDOL) 1.25 MG (50000 UNIT) CAPS capsule   Return for maintain upcoming appt in January.     Alysia Penna, NP

## 2023-05-18 NOTE — Assessment & Plan Note (Addendum)
Valtrex sent.

## 2023-05-19 LAB — COMPREHENSIVE METABOLIC PANEL
AG Ratio: 1.2 (calc) (ref 1.0–2.5)
ALT: 11 U/L (ref 6–29)
AST: 11 U/L (ref 10–35)
Albumin: 4 g/dL (ref 3.6–5.1)
Alkaline phosphatase (APISO): 236 U/L — ABNORMAL HIGH (ref 31–125)
BUN: 7 mg/dL (ref 7–25)
CO2: 24 mmol/L (ref 20–32)
Calcium: 8.4 mg/dL — ABNORMAL LOW (ref 8.6–10.2)
Chloride: 101 mmol/L (ref 98–110)
Creat: 0.67 mg/dL (ref 0.50–0.99)
Globulin: 3.4 g/dL (ref 1.9–3.7)
Glucose, Bld: 77 mg/dL (ref 65–99)
Potassium: 4.2 mmol/L (ref 3.5–5.3)
Sodium: 139 mmol/L (ref 135–146)
Total Bilirubin: 0.4 mg/dL (ref 0.2–1.2)
Total Protein: 7.4 g/dL (ref 6.1–8.1)

## 2023-05-19 LAB — HEMOGLOBIN A1C
Hgb A1c MFr Bld: 6.4 %{Hb} — ABNORMAL HIGH (ref <5.7)
Mean Plasma Glucose: 137 mg/dL
eAG (mmol/L): 7.6 mmol/L

## 2023-05-19 LAB — LDL CHOLESTEROL, DIRECT: Direct LDL: 139 mg/dL — ABNORMAL HIGH (ref <100)

## 2023-05-22 ENCOUNTER — Other Ambulatory Visit: Payer: Self-pay | Admitting: Nurse Practitioner

## 2023-05-22 ENCOUNTER — Telehealth: Payer: Self-pay | Admitting: Nurse Practitioner

## 2023-05-22 DIAGNOSIS — E785 Hyperlipidemia, unspecified: Secondary | ICD-10-CM

## 2023-05-22 DIAGNOSIS — E1169 Type 2 diabetes mellitus with other specified complication: Secondary | ICD-10-CM

## 2023-05-22 NOTE — Addendum Note (Signed)
Addended by: Alysia Penna L on: 05/22/2023 03:28 PM   Modules accepted: Orders

## 2023-05-22 NOTE — Telephone Encounter (Signed)
Pt called to let you know she would like to start the Atorvastatin.

## 2023-05-23 ENCOUNTER — Other Ambulatory Visit: Payer: Self-pay | Admitting: Nurse Practitioner

## 2023-05-23 ENCOUNTER — Other Ambulatory Visit (HOSPITAL_BASED_OUTPATIENT_CLINIC_OR_DEPARTMENT_OTHER): Payer: Self-pay

## 2023-05-23 DIAGNOSIS — R928 Other abnormal and inconclusive findings on diagnostic imaging of breast: Secondary | ICD-10-CM

## 2023-05-23 MED ORDER — ATORVASTATIN CALCIUM 10 MG PO TABS
10.0000 mg | ORAL_TABLET | Freq: Every evening | ORAL | 3 refills | Status: DC
  Filled 2023-05-23: qty 90, 90d supply, fill #0

## 2023-05-23 NOTE — Telephone Encounter (Signed)
Spoke to patient and she agreed to plan.

## 2023-05-28 ENCOUNTER — Other Ambulatory Visit: Payer: Self-pay

## 2023-05-28 ENCOUNTER — Other Ambulatory Visit (HOSPITAL_BASED_OUTPATIENT_CLINIC_OR_DEPARTMENT_OTHER): Payer: Self-pay

## 2023-06-15 ENCOUNTER — Ambulatory Visit: Payer: 59

## 2023-06-15 ENCOUNTER — Other Ambulatory Visit: Payer: 59

## 2023-06-15 ENCOUNTER — Ambulatory Visit
Admission: RE | Admit: 2023-06-15 | Discharge: 2023-06-15 | Disposition: A | Discharge status: Home / Self Care | Payer: 59 | Source: Ambulatory Visit | Attending: Nurse Practitioner | Admitting: Nurse Practitioner

## 2023-06-15 DIAGNOSIS — R928 Other abnormal and inconclusive findings on diagnostic imaging of breast: Secondary | ICD-10-CM

## 2023-07-12 ENCOUNTER — Other Ambulatory Visit (HOSPITAL_BASED_OUTPATIENT_CLINIC_OR_DEPARTMENT_OTHER): Payer: Self-pay

## 2023-08-01 ENCOUNTER — Other Ambulatory Visit (HOSPITAL_BASED_OUTPATIENT_CLINIC_OR_DEPARTMENT_OTHER): Payer: Self-pay

## 2023-08-17 ENCOUNTER — Telehealth: Payer: Self-pay

## 2023-08-17 NOTE — Telephone Encounter (Signed)
Spoke to patient and informed her that a yearly eye exam is due (diabetic retina vue) is needed. Patient verbalized understanding and have a appointment set for next Friday.

## 2023-08-24 ENCOUNTER — Ambulatory Visit: Payer: 59 | Admitting: Nurse Practitioner

## 2023-08-29 ENCOUNTER — Other Ambulatory Visit (HOSPITAL_BASED_OUTPATIENT_CLINIC_OR_DEPARTMENT_OTHER): Payer: Self-pay

## 2023-08-29 ENCOUNTER — Other Ambulatory Visit: Payer: Self-pay | Admitting: Nurse Practitioner

## 2023-08-29 DIAGNOSIS — K219 Gastro-esophageal reflux disease without esophagitis: Secondary | ICD-10-CM

## 2023-08-29 MED ORDER — PANTOPRAZOLE SODIUM 20 MG PO TBEC
20.0000 mg | DELAYED_RELEASE_TABLET | Freq: Every day | ORAL | 0 refills | Status: DC
  Filled 2023-08-29: qty 90, 90d supply, fill #0

## 2023-09-14 ENCOUNTER — Ambulatory Visit: Payer: 59 | Admitting: Nurse Practitioner

## 2023-09-18 ENCOUNTER — Ambulatory Visit: Payer: 59 | Admitting: Nurse Practitioner

## 2023-09-24 ENCOUNTER — Encounter: Payer: Self-pay | Admitting: Nurse Practitioner

## 2023-09-24 ENCOUNTER — Other Ambulatory Visit: Payer: Self-pay

## 2023-09-24 ENCOUNTER — Ambulatory Visit: Payer: 59 | Admitting: Nurse Practitioner

## 2023-09-24 ENCOUNTER — Other Ambulatory Visit (HOSPITAL_BASED_OUTPATIENT_CLINIC_OR_DEPARTMENT_OTHER): Payer: Self-pay

## 2023-09-24 VITALS — BP 126/72 | HR 100 | Temp 97.3°F | Ht 67.0 in | Wt 177.0 lb

## 2023-09-24 DIAGNOSIS — Z7985 Long-term (current) use of injectable non-insulin antidiabetic drugs: Secondary | ICD-10-CM | POA: Diagnosis not present

## 2023-09-24 DIAGNOSIS — I10 Essential (primary) hypertension: Secondary | ICD-10-CM | POA: Diagnosis not present

## 2023-09-24 DIAGNOSIS — E1169 Type 2 diabetes mellitus with other specified complication: Secondary | ICD-10-CM

## 2023-09-24 DIAGNOSIS — Z3040 Encounter for surveillance of contraceptives, unspecified: Secondary | ICD-10-CM

## 2023-09-24 DIAGNOSIS — Z7984 Long term (current) use of oral hypoglycemic drugs: Secondary | ICD-10-CM | POA: Diagnosis not present

## 2023-09-24 DIAGNOSIS — E785 Hyperlipidemia, unspecified: Secondary | ICD-10-CM

## 2023-09-24 DIAGNOSIS — E039 Hypothyroidism, unspecified: Secondary | ICD-10-CM

## 2023-09-24 DIAGNOSIS — E282 Polycystic ovarian syndrome: Secondary | ICD-10-CM | POA: Diagnosis not present

## 2023-09-24 LAB — COMPREHENSIVE METABOLIC PANEL
ALT: 10 U/L (ref 0–35)
AST: 11 U/L (ref 0–37)
Albumin: 3.8 g/dL (ref 3.5–5.2)
Alkaline Phosphatase: 195 U/L — ABNORMAL HIGH (ref 39–117)
BUN: 7 mg/dL (ref 6–23)
CO2: 25 meq/L (ref 19–32)
Calcium: 8.9 mg/dL (ref 8.4–10.5)
Chloride: 99 meq/L (ref 96–112)
Creatinine, Ser: 0.7 mg/dL (ref 0.40–1.20)
GFR: 102.96 mL/min (ref >60.00)
Glucose, Bld: 100 mg/dL — ABNORMAL HIGH (ref 70–99)
Potassium: 3.2 meq/L — ABNORMAL LOW (ref 3.5–5.1)
Sodium: 135 meq/L (ref 135–145)
Total Bilirubin: 0.5 mg/dL (ref 0.2–1.2)
Total Protein: 7.7 g/dL (ref 6.0–8.3)

## 2023-09-24 LAB — LIPID PANEL
Cholesterol: 194 mg/dL (ref 0–200)
HDL: 54.3 mg/dL (ref >39.00)
LDL Cholesterol: 117 mg/dL — ABNORMAL HIGH (ref 0–99)
NonHDL: 139.91
Total CHOL/HDL Ratio: 4
Triglycerides: 114 mg/dL (ref 0.0–149.0)
VLDL: 22.8 mg/dL (ref 0.0–40.0)

## 2023-09-24 LAB — HEMOGLOBIN A1C: Hgb A1c MFr Bld: 6.3 % (ref 4.6–6.5)

## 2023-09-24 LAB — TSH: TSH: 1.3 u[IU]/mL (ref 0.35–5.50)

## 2023-09-24 LAB — T4, FREE: Free T4: 0.81 ng/dL (ref 0.60–1.60)

## 2023-09-24 MED ORDER — AMLODIPINE BESYLATE 5 MG PO TABS
5.0000 mg | ORAL_TABLET | Freq: Every day | ORAL | 3 refills | Status: AC
  Filled 2023-09-24 – 2023-11-02 (×2): qty 90, 90d supply, fill #0
  Filled 2024-02-14: qty 90, 90d supply, fill #1
  Filled 2024-06-09: qty 90, 90d supply, fill #2

## 2023-09-24 MED ORDER — SPIRONOLACTONE 100 MG PO TABS
100.0000 mg | ORAL_TABLET | Freq: Every day | ORAL | 1 refills | Status: AC
  Filled 2023-09-24: qty 90, 90d supply, fill #0
  Filled 2024-03-26: qty 90, 90d supply, fill #1

## 2023-09-24 MED ORDER — METFORMIN HCL ER 750 MG PO TB24
750.0000 mg | ORAL_TABLET | Freq: Every day | ORAL | 1 refills | Status: DC
  Filled 2023-09-24: qty 90, 90d supply, fill #0

## 2023-09-24 MED ORDER — LEVONORGEST-ETH ESTRAD 91-DAY 0.15-0.03 MG PO TABS
1.0000 | ORAL_TABLET | Freq: Every day | ORAL | 3 refills | Status: AC
  Filled 2023-09-24 – 2023-10-05 (×2): qty 91, 91d supply, fill #0
  Filled 2024-01-01: qty 91, 91d supply, fill #1
  Filled 2024-03-25: qty 91, 91d supply, fill #2
  Filled 2024-06-18: qty 91, 91d supply, fill #3

## 2023-09-24 NOTE — Progress Notes (Signed)
 Established Patient Visit  Patient: Cheryl Welch   DOB: 01-Feb-1976   48 y.o. Female  MRN: [id number] Visit Date: 09/24/2023  Subjective:    Chief Complaint  Patient presents with   Follow-up    6 months f/u  Medication refill needed on Ozempic, Amlodipine, Pantoprazole and birth control    HPI HTN (hypertension) BP at goal with amlodipine and spironolactone BP Readings from Last 3 Encounters:  09/24/23 126/72  05/18/23 124/76  02/16/23 120/80    Maintain med doses Repeat CMP F/up in 6months  DM (diabetes mellitus) (HCC) Controlled with ozempic 0.5mg  and metformin Reports mild constipation-advised to use colace or miralax, maintain adequate oral hydration and high fiber diet BP at goal No DM retinopathy or neuropathy. Current use of statin  Repeat hgbA1c, UACr, CMP, and lipid panel Maintain med doses Advised to schedule annual DIABETES eye exam F/up in 6months  Hyperlipidemia associated with type 2 diabetes mellitus (HCC) Repeat lipid panel Maintain lipitor dose  Hypothyroidism No use of med at this time, asymptomatic Repeat THYROID and T4  Encounter for refill of prescription for contraception Maintain COC dose, refill sent UTD with PAP  Exercise: walking daily. Diet: daily, high protein Has upcoming appointment with ophthalmology.  BP Readings from Last 3 Encounters:  09/24/23 126/72  05/18/23 124/76  02/16/23 120/80    Wt Readings from Last 3 Encounters:  09/24/23 177 lb (80.3 kg)  05/18/23 182 lb 9.6 oz (82.8 kg)  02/16/23 203 lb (92.1 kg)    Reviewed medical, surgical, and social history today  Medications: Outpatient Medications Prior to Visit  Medication Sig   atorvastatin (LIPITOR) 10 MG tablet Take 1 tablet (10 mg total) by mouth every evening.   pantoprazole (PROTONIX) 20 MG tablet Take 1 tablet (20 mg total) by mouth daily.   Semaglutide,0.25 or 0.5MG /DOS, (OZEMPIC, 0.25 OR 0.5 MG/DOSE,) 2 MG/3ML SOPN Inject 0.5 mg  into the skin once a week.   valACYclovir (VALTREX) 500 MG tablet Take 1 tablet (500 mg total) by mouth 2 (two) times daily.   [DISCONTINUED] amLODipine (NORVASC) 5 MG tablet Take 1 tablet (5 mg total) by mouth daily.   [DISCONTINUED] cyclobenzaprine (FLEXERIL) 5 MG tablet Take 1-2 tablets (5-10 mg total) by mouth at bedtime.   [DISCONTINUED] levonorgestrel-ethinyl estradiol (SEASONALE) 0.15-0.03 MG tablet Take 1 tablet by mouth daily.   [DISCONTINUED] metFORMIN (GLUCOPHAGE-XR) 750 MG 24 hr tablet Take 1 tablet (750 mg total) by mouth daily with breakfast.   [DISCONTINUED] predniSONE (DELTASONE) 20 MG tablet Take 2 tablets (40 mg total) by mouth daily with breakfast.   [DISCONTINUED] spironolactone (ALDACTONE) 100 MG tablet Take 1 tablet (100 mg total) by mouth 2 (two) times daily.   [DISCONTINUED] Vitamin D, Ergocalciferol, (DRISDOL) 1.25 MG (50000 UNIT) CAPS capsule Take 1 capsule (50,000 Units total) by mouth every 7 (seven) days.   No facility-administered medications prior to visit.   Reviewed past medical and social history.   ROS per HPI above      Objective:  BP 126/72 (BP Location: Left Arm, Patient Position: Sitting, Cuff Size: Normal)   Pulse 100   Temp (!) 97.3 F (36.3 C) (Temporal)   Ht 5\' 7"  (1.702 m)   Wt 177 lb (80.3 kg)   SpO2 98%   BMI 27.72 kg/m      Physical Exam Vitals and nursing note reviewed.  Cardiovascular:     Rate and Rhythm: Normal  rate and regular rhythm.     Pulses: Normal pulses.     Heart sounds: Normal heart sounds.  Pulmonary:     Effort: Pulmonary effort is normal.     Breath sounds: Normal breath sounds.  Musculoskeletal:     Right lower leg: No edema.     Left lower leg: No edema.  Neurological:     Mental Status: She is alert and oriented to person, place, and time.     No results found for any visits on 09/24/23.    Assessment & Plan:    Problem List Items Addressed This Visit     DM (diabetes mellitus) (HCC)   Controlled  with ozempic 0.5mg  and metformin Reports mild constipation-advised to use colace or miralax, maintain adequate oral hydration and high fiber diet BP at goal No DM retinopathy or neuropathy. Current use of statin  Repeat hgbA1c, UACr, CMP, and lipid panel Maintain med doses Advised to schedule annual DIABETES eye exam F/up in 6months      Relevant Medications   metFORMIN (GLUCOPHAGE-XR) 750 MG 24 hr tablet   Other Relevant Orders   Hemoglobin A1c   Comprehensive metabolic panel   Microalbumin / creatinine urine ratio   Encounter for refill of prescription for contraception   Maintain COC dose, refill sent UTD with PAP      Relevant Medications   levonorgestrel-ethinyl estradiol (SEASONALE) 0.15-0.03 MG tablet   HTN (hypertension) - Primary   BP at goal with amlodipine and spironolactone BP Readings from Last 3 Encounters:  09/24/23 126/72  05/18/23 124/76  02/16/23 120/80    Maintain med doses Repeat CMP F/up in 6months      Relevant Medications   amLODipine (NORVASC) 5 MG tablet   spironolactone (ALDACTONE) 100 MG tablet   Other Relevant Orders   Comprehensive metabolic panel   Hyperlipidemia associated with type 2 diabetes mellitus (HCC)   Repeat lipid panel Maintain lipitor dose      Relevant Medications   amLODipine (NORVASC) 5 MG tablet   metFORMIN (GLUCOPHAGE-XR) 750 MG 24 hr tablet   spironolactone (ALDACTONE) 100 MG tablet   Other Relevant Orders   Lipid panel   Comprehensive metabolic panel   Hypothyroidism   No use of med at this time, asymptomatic Repeat THYROID and T4      Relevant Orders   T4, free   TSH   Other Visit Diagnoses       PCOS (polycystic ovarian syndrome)       Relevant Medications   metFORMIN (GLUCOPHAGE-XR) 750 MG 24 hr tablet   spironolactone (ALDACTONE) 100 MG tablet      Return in about 6 months (around 03/26/2024) for CPE (fasting).     Alysia Penna, NP

## 2023-09-24 NOTE — Patient Instructions (Signed)
 Go to lab Maintain Heart healthy diet and daily exercise. Maintain current medications. Schedule appointment for DIABETES eye exam and mammogram

## 2023-09-24 NOTE — Assessment & Plan Note (Signed)
 Controlled with ozempic 0.5mg  and metformin Reports mild constipation-advised to use colace or miralax, maintain adequate oral hydration and high fiber diet BP at goal No DM retinopathy or neuropathy. Current use of statin  Repeat hgbA1c, UACr, CMP, and lipid panel Maintain med doses Advised to schedule annual DIABETES eye exam F/up in 6months

## 2023-09-24 NOTE — Assessment & Plan Note (Signed)
Repeat lipid panel °Maintain lipitor dose °

## 2023-09-24 NOTE — Assessment & Plan Note (Signed)
 Maintain COC dose, refill sent UTD with PAP

## 2023-09-24 NOTE — Assessment & Plan Note (Addendum)
 No use of med at this time, asymptomatic Repeat THYROID and T4

## 2023-09-24 NOTE — Assessment & Plan Note (Signed)
 BP at goal with amlodipine and spironolactone BP Readings from Last 3 Encounters:  09/24/23 126/72  05/18/23 124/76  02/16/23 120/80    Maintain med doses Repeat CMP F/up in 6months

## 2023-09-25 ENCOUNTER — Other Ambulatory Visit: Payer: Self-pay | Admitting: Nurse Practitioner

## 2023-09-25 ENCOUNTER — Other Ambulatory Visit (HOSPITAL_BASED_OUTPATIENT_CLINIC_OR_DEPARTMENT_OTHER): Payer: Self-pay

## 2023-09-25 ENCOUNTER — Encounter: Payer: Self-pay | Admitting: Nurse Practitioner

## 2023-09-25 DIAGNOSIS — E876 Hypokalemia: Secondary | ICD-10-CM

## 2023-09-25 DIAGNOSIS — E1169 Type 2 diabetes mellitus with other specified complication: Secondary | ICD-10-CM

## 2023-09-25 LAB — MICROALBUMIN / CREATININE URINE RATIO
Creatinine,U: 227 mg/dL
Microalb Creat Ratio: 19 mg/g (ref 0.0–30.0)
Microalb, Ur: 4.3 mg/dL — ABNORMAL HIGH (ref 0.0–1.9)

## 2023-09-25 MED ORDER — POTASSIUM CHLORIDE CRYS ER 20 MEQ PO TBCR
40.0000 meq | EXTENDED_RELEASE_TABLET | Freq: Every day | ORAL | 0 refills | Status: DC
  Filled 2023-09-25: qty 6, 3d supply, fill #0

## 2023-09-25 MED ORDER — ATORVASTATIN CALCIUM 20 MG PO TABS
20.0000 mg | ORAL_TABLET | Freq: Every evening | ORAL | 1 refills | Status: DC
  Filled 2023-09-25: qty 90, 90d supply, fill #0

## 2023-09-27 ENCOUNTER — Other Ambulatory Visit (HOSPITAL_BASED_OUTPATIENT_CLINIC_OR_DEPARTMENT_OTHER): Payer: Self-pay

## 2023-09-27 MED ORDER — OZEMPIC (0.25 OR 0.5 MG/DOSE) 2 MG/3ML ~~LOC~~ SOPN
0.5000 mg | PEN_INJECTOR | SUBCUTANEOUS | 1 refills | Status: DC
  Filled 2023-09-27: qty 9, 84d supply, fill #0
  Filled 2023-12-23: qty 9, 84d supply, fill #1

## 2023-10-05 ENCOUNTER — Other Ambulatory Visit (HOSPITAL_BASED_OUTPATIENT_CLINIC_OR_DEPARTMENT_OTHER): Payer: Self-pay

## 2023-11-02 ENCOUNTER — Other Ambulatory Visit (HOSPITAL_BASED_OUTPATIENT_CLINIC_OR_DEPARTMENT_OTHER): Payer: Self-pay

## 2023-12-06 ENCOUNTER — Other Ambulatory Visit: Payer: Self-pay | Admitting: Nurse Practitioner

## 2023-12-06 ENCOUNTER — Other Ambulatory Visit (HOSPITAL_BASED_OUTPATIENT_CLINIC_OR_DEPARTMENT_OTHER): Payer: Self-pay

## 2023-12-06 ENCOUNTER — Other Ambulatory Visit: Payer: Self-pay

## 2023-12-06 DIAGNOSIS — K219 Gastro-esophageal reflux disease without esophagitis: Secondary | ICD-10-CM

## 2023-12-06 MED ORDER — PANTOPRAZOLE SODIUM 20 MG PO TBEC
20.0000 mg | DELAYED_RELEASE_TABLET | Freq: Every day | ORAL | 1 refills | Status: DC
  Filled 2023-12-06: qty 90, 90d supply, fill #0
  Filled 2024-03-17: qty 90, 90d supply, fill #1

## 2024-03-28 ENCOUNTER — Encounter: Admitting: Nurse Practitioner

## 2024-03-28 DIAGNOSIS — Z0279 Encounter for issue of other medical certificate: Secondary | ICD-10-CM

## 2024-03-31 ENCOUNTER — Encounter: Payer: Self-pay | Admitting: Nurse Practitioner

## 2024-03-31 ENCOUNTER — Ambulatory Visit: Admitting: Nurse Practitioner

## 2024-03-31 VITALS — BP 126/78 | HR 96 | Temp 98.1°F | Ht 67.0 in | Wt 180.0 lb

## 2024-03-31 DIAGNOSIS — E559 Vitamin D deficiency, unspecified: Secondary | ICD-10-CM | POA: Diagnosis not present

## 2024-03-31 DIAGNOSIS — Z0000 Encounter for general adult medical examination without abnormal findings: Secondary | ICD-10-CM

## 2024-03-31 DIAGNOSIS — I10 Essential (primary) hypertension: Secondary | ICD-10-CM | POA: Diagnosis not present

## 2024-03-31 DIAGNOSIS — N812 Incomplete uterovaginal prolapse: Secondary | ICD-10-CM | POA: Diagnosis not present

## 2024-03-31 DIAGNOSIS — Z7985 Long-term (current) use of injectable non-insulin antidiabetic drugs: Secondary | ICD-10-CM

## 2024-03-31 DIAGNOSIS — N393 Stress incontinence (female) (male): Secondary | ICD-10-CM

## 2024-03-31 DIAGNOSIS — E1169 Type 2 diabetes mellitus with other specified complication: Secondary | ICD-10-CM

## 2024-03-31 DIAGNOSIS — Z7984 Long term (current) use of oral hypoglycemic drugs: Secondary | ICD-10-CM | POA: Diagnosis not present

## 2024-03-31 DIAGNOSIS — E038 Other specified hypothyroidism: Secondary | ICD-10-CM

## 2024-03-31 DIAGNOSIS — E785 Hyperlipidemia, unspecified: Secondary | ICD-10-CM | POA: Diagnosis not present

## 2024-03-31 DIAGNOSIS — Z0001 Encounter for general adult medical examination with abnormal findings: Secondary | ICD-10-CM

## 2024-03-31 LAB — COMPREHENSIVE METABOLIC PANEL WITH GFR
ALT: 13 U/L (ref 0–35)
AST: 10 U/L (ref 0–37)
Albumin: 3.7 g/dL (ref 3.5–5.2)
Alkaline Phosphatase: 221 U/L — ABNORMAL HIGH (ref 39–117)
BUN: 8 mg/dL (ref 6–23)
CO2: 26 meq/L (ref 19–32)
Calcium: 9.1 mg/dL (ref 8.4–10.5)
Chloride: 100 meq/L (ref 96–112)
Creatinine, Ser: 0.73 mg/dL (ref 0.40–1.20)
GFR: 97.55 mL/min (ref >60.00)
Glucose, Bld: 114 mg/dL — ABNORMAL HIGH (ref 70–99)
Potassium: 3.4 meq/L — ABNORMAL LOW (ref 3.5–5.1)
Sodium: 137 meq/L (ref 135–145)
Total Bilirubin: 0.3 mg/dL (ref 0.2–1.2)
Total Protein: 7.5 g/dL (ref 6.0–8.3)

## 2024-03-31 LAB — HEMOGLOBIN A1C: Hgb A1c MFr Bld: 7.1 % — ABNORMAL HIGH (ref 4.6–6.5)

## 2024-03-31 LAB — LIPID PANEL
Cholesterol: 190 mg/dL (ref 0–200)
HDL: 54.4 mg/dL (ref >39.00)
LDL Cholesterol: 111 mg/dL — ABNORMAL HIGH (ref 0–99)
NonHDL: 135.77
Total CHOL/HDL Ratio: 3
Triglycerides: 122 mg/dL (ref 0.0–149.0)
VLDL: 24.4 mg/dL (ref 0.0–40.0)

## 2024-03-31 LAB — T4, FREE: Free T4: 0.64 ng/dL (ref 0.60–1.60)

## 2024-03-31 LAB — TSH: TSH: 2.45 u[IU]/mL (ref 0.35–5.50)

## 2024-03-31 LAB — VITAMIN D 25 HYDROXY (VIT D DEFICIENCY, FRACTURES): VITD: 23.83 ng/mL — ABNORMAL LOW (ref 30.00–100.00)

## 2024-03-31 NOTE — Progress Notes (Signed)
 Complete physical exam  Patient: Cheryl Welch   DOB: 1976/01/25   48 y.o. Female  MRN: [id number] Visit Date: 03/31/2024  Subjective:    Chief Complaint  Patient presents with   Annual Exam    FASTING  Refills all medications     Cheryl Welch is a 48 y.o. female who presents today for a complete physical exam. She reports consuming a low fat diet. Walking 3x/week, each time She generally feels well. She reports sleeping well. She does have additional problems to discuss today.  Vision:No Dental:Yes STD Screen:No  BP Readings from Last 3 Encounters:  03/31/24 126/78  09/24/23 126/72  05/18/23 124/76   Wt Readings from Last 3 Encounters:  03/31/24 180 lb (81.6 kg)  09/24/23 177 lb (80.3 kg)  05/18/23 182 lb 9.6 oz (82.8 kg)    Most recent fall risk assessment:    03/31/2024   10:05 AM  Fall Risk   Falls in the past year? 0  Injury with Fall? 0  Risk for fall due to : No Fall Risks  Follow up Falls evaluation completed     Depression screen:Yes - No Depression Most recent depression screenings:    03/31/2024   10:05 AM 09/24/2023   11:12 AM  PHQ 2/9 Scores  PHQ - 2 Score 0 0  PHQ- 9 Score 0 0    HPI  HTN (hypertension) BP at goal with amlodipine  and spironolactone  BP Readings from Last 3 Encounters:  03/31/24 126/78  09/24/23 126/72  05/18/23 124/76    Maintain med doses Repeat CMP F/up in 6months  Subclinical hypothyroidism No use of med at this time, asymptomatic Repeat Tsh and T4  Hyperlipidemia associated with type 2 diabetes mellitus (HCC) Repeat lipid panel Maintain lipitor dose  DM (diabetes mellitus) (HCC) Controlled with ozempic  0.5mg  and metformin  Resolved constipation BP at goal Normal foot exam today, no neuropathy. Current use of statin  Repeat hgbA1c, CMP, and lipid panel Maintain med doses Advised to schedule annual DIABETES eye exam F/up in 6months  Vitamin D  deficiency Use of OVER THE COUNTER dose 2000Iu  daily Repeat vit. D   Past Medical History:  Diagnosis Date   Abnormal Pap smear    2004; LGSIL??>colpo>nml   Abnormal thyroid  function test    Allergy    Arthritis    Diabetes mellitus without complication (HCC)    DM (diabetes mellitus) (HCC) 10/17/2017   Encounter for refill of prescription for contraception 08/01/2022   GERD (gastroesophageal reflux disease)    Hematuria 09/03/2015   Herpes simplex 09/03/2015   Hirsutism    HSV infection    HTN (hypertension) 07/05/2021   Hyperlipidemia associated with type 2 diabetes mellitus (HCC) 08/17/2022   Hypothyroidism 07/20/2015   Left flank pain 10/17/2017   PCO (polycystic ovaries)    Prediabetes    Past Surgical History:  Procedure Laterality Date   CESAREAN SECTION  07/24/1992   Social History   Socioeconomic History   Marital status: Married    Spouse name: Not on file   Number of children: 2   Years of education: Not on file   Highest education level: Associate degree: academic program  Occupational History   Occupation: Producer, television/film/video: [employer]  Tobacco Use   Smoking status: Never   Smokeless tobacco: Never  Vaping Use   Vaping status: Never Used  Substance and Sexual Activity   Alcohol use: Yes    Comment: occassionally   Drug use: No  Sexual activity: Yes    Partners: Male    Birth control/protection: Pill  Other Topics Concern   Not on file  Social History Narrative   Not on file   Social Drivers of Health   Financial Resource Strain: Low Risk  (03/30/2024)   Overall Financial Resource Strain (CARDIA)    Difficulty of Paying Living Expenses: Not hard at all  Food Insecurity: No Food Insecurity (03/30/2024)   Hunger Vital Sign    Worried About Running Out of Food in the Last Year: Never true    Ran Out of Food in the Last Year: Never true  Transportation Needs: No Transportation Needs (03/30/2024)   PRAPARE - Administrator, Civil Service (Medical): No    Lack of  Transportation (Non-Medical): No  Physical Activity: Insufficiently Active (03/30/2024)   Exercise Vital Sign    Days of Exercise per Week: 3 days    Minutes of Exercise per Session: 30 min  Stress: No Stress Concern Present (03/30/2024)   [employer] of Occupational Health - Occupational Stress Questionnaire    Feeling of Stress: Not at all  Social Connections: Moderately Integrated (03/30/2024)   Social Connection and Isolation Panel    Frequency of Communication with Friends and Family: More than three times a week    Frequency of Social Gatherings with Friends and Family: Once a week    Attends Religious Services: 1 to 4 times per year    Active Member of [employer] or Organizations: No    Attends Engineer, structural: Not on file    Marital Status: Married  Catering manager Violence: Not on file   Family Status  Relation Name Status   Mother  (Not Specified)   Father father (Not Specified)   MGM grandmother (Not Specified)   Neg Hx  (Not Specified)  No partnership data on file   Family History  Problem Relation Age of Onset   Hypertension Mother    Cancer Father        kidney   Hypertension Father    Colon polyps Father    Other Father        esophageal stricture   Diabetes Maternal Grandmother    Stroke Maternal Grandmother    Colon cancer Neg Hx    Esophageal cancer Neg Hx    Breast cancer Neg Hx    No Known Allergies  Patient Care Team: Vincenzina Jagoda, Roselie Rockford, NP as PCP - General (Internal Medicine)   Medications: Outpatient Medications Prior to Visit  Medication Sig   amLODipine  (NORVASC ) 5 MG tablet Take 1 tablet (5 mg total) by mouth daily.   atorvastatin  (LIPITOR) 20 MG tablet Take 1 tablet (20 mg total) by mouth every evening.   levonorgestrel -ethinyl estradiol  (SEASONALE) 0.15-0.03 MG tablet Take 1 tablet by mouth daily.   metFORMIN  (GLUCOPHAGE -XR) 750 MG 24 hr tablet Take 1 tablet (750 mg total) by mouth daily with breakfast.   pantoprazole   (PROTONIX ) 20 MG tablet Take 1 tablet (20 mg total) by mouth daily.   potassium chloride  SA (KLOR-CON  M) 20 MEQ tablet Take 2 tablets (40 mEq total) by mouth daily.   Semaglutide ,0.25 or 0.5MG /DOS, (OZEMPIC , 0.25 OR 0.5 MG/DOSE,) 2 MG/3ML SOPN Inject 0.5 mg into the skin once a week.   spironolactone  (ALDACTONE ) 100 MG tablet Take 1 tablet (100 mg total) by mouth daily.   valACYclovir  (VALTREX ) 500 MG tablet Take 1 tablet (500 mg total) by mouth 2 (two) times daily.   No facility-administered medications  prior to visit.    Review of Systems  Constitutional:  Negative for activity change, appetite change and unexpected weight change.  Respiratory: Negative.    Cardiovascular: Negative.   Gastrointestinal: Negative.   Endocrine: Negative for cold intolerance and heat intolerance.  Genitourinary: Negative.   Musculoskeletal: Negative.   Skin: Negative.   Neurological: Negative.   Hematological: Negative.   Psychiatric/Behavioral:  Negative for behavioral problems, decreased concentration, dysphoric mood, hallucinations, self-injury, sleep disturbance and suicidal ideas. The patient is not nervous/anxious.        Objective:  BP 126/78 (BP Location: Left Arm, Patient Position: Sitting, Cuff Size: Large)   Pulse 96   Temp 98.1 F (36.7 C) (Oral)   Ht 5' 7 (1.702 m)   Wt 180 lb (81.6 kg)   SpO2 100%   BMI 28.19 kg/m     Physical Exam Vitals and nursing note reviewed.  Constitutional:      General: She is not in acute distress. HENT:     Right Ear: Tympanic membrane, ear canal and external ear normal.     Left Ear: Tympanic membrane, ear canal and external ear normal.     Nose: Nose normal.  Eyes:     Extraocular Movements: Extraocular movements intact.     Conjunctiva/sclera: Conjunctivae normal.     Pupils: Pupils are equal, round, and reactive to light.  Neck:     Thyroid : No thyroid  mass, thyromegaly or thyroid  tenderness.  Cardiovascular:     Rate and Rhythm: Normal  rate and regular rhythm.     Pulses: Normal pulses.     Heart sounds: Normal heart sounds.  Pulmonary:     Effort: Pulmonary effort is normal.     Breath sounds: Normal breath sounds.  Abdominal:     General: Bowel sounds are normal.     Palpations: Abdomen is soft.  Musculoskeletal:        General: Normal range of motion.     Cervical back: Normal range of motion and neck supple.     Right lower leg: No edema.     Left lower leg: No edema.  Lymphadenopathy:     Cervical: No cervical adenopathy.  Skin:    General: Skin is warm and dry.  Neurological:     Mental Status: She is alert and oriented to person, place, and time.     Cranial Nerves: No cranial nerve deficit.  Psychiatric:        Mood and Affect: Mood normal.        Behavior: Behavior normal.        Thought Content: Thought content normal.      No results found for any visits on 03/31/24.    Assessment & Plan:    Routine Health Maintenance and Physical Exam  Immunization History  Administered Date(s) Administered   Influenza,inj,Quad PF,6-35 Mos 04/30/2013   Influenza-Unspecified 04/25/2019, 05/05/2020, 05/08/2021, 05/07/2022, 05/06/2023   Novel Infuenza-h1n1-09 06/24/2008   PFIZER(Purple Top)SARS-COV-2 Vaccination 10/03/2019, 10/24/2019   Tdap 03/27/2007   Health Maintenance  Topic Date Due   OPHTHALMOLOGY EXAM  06/24/2023   HEMOGLOBIN A1C  03/26/2024   Influenza Vaccine  05/23/2024 (Originally 02/22/2024)   COVID-19 Vaccine (3 - Pfizer risk series) 05/23/2024 (Originally 11/21/2019)   Pneumococcal Vaccine (1 of 2 - PCV) 09/23/2024 (Originally 04/06/1995)   Hepatitis B Vaccines 19-59 Average Risk (1 of 3 - 19+ 3-dose series) 03/31/2025 (Originally 04/06/1995)   Hepatitis C Screening  03/31/2025 (Originally 04/05/1994)   HIV Screening  03/31/2025 (Originally  04/06/1991)   Diabetic kidney evaluation - eGFR measurement  09/23/2024   Diabetic kidney evaluation - Urine ACR  09/23/2024   FOOT EXAM  03/31/2025    Cervical Cancer Screening (HPV/Pap Cotest)  11/01/2025   Fecal DNA (Cologuard)  02/23/2026   HPV VACCINES  Aged Out   Meningococcal B Vaccine  Aged Out   DTaP/Tdap/Td  Discontinued   Discussed health benefits of physical activity, and encouraged her to engage in regular exercise appropriate for her age and condition.  Problem List Items Addressed This Visit     DM (diabetes mellitus) (HCC)   Controlled with ozempic  0.5mg  and metformin  Resolved constipation BP at goal Normal foot exam today, no neuropathy. Current use of statin  Repeat hgbA1c, CMP, and lipid panel Maintain med doses Advised to schedule annual DIABETES eye exam F/up in 6months      Relevant Orders   Hemoglobin A1c   HTN (hypertension)   BP at goal with amlodipine  and spironolactone  BP Readings from Last 3 Encounters:  03/31/24 126/78  09/24/23 126/72  05/18/23 124/76    Maintain med doses Repeat CMP F/up in 6months      Hyperlipidemia associated with type 2 diabetes mellitus (HCC)   Repeat lipid panel Maintain lipitor dose      Relevant Orders   Lipid panel   Stress incontinence   Relevant Orders   Ambulatory referral to Urogynecology   Subclinical hypothyroidism   No use of med at this time, asymptomatic Repeat Tsh and T4      Relevant Orders   T4, free   TSH   Uterovaginal prolapse, incomplete   Relevant Orders   Ambulatory referral to Urogynecology   Vitamin D  deficiency   Use of OVER THE COUNTER dose 2000Iu daily Repeat vit. D      Relevant Orders   VITAMIN D  25 Hydroxy (Vit-D Deficiency, Fractures)   Other Visit Diagnoses       Encounter for preventative adult health care exam with abnormal findings    -  Primary   Relevant Orders   Comprehensive metabolic panel with GFR      Return in about 6 months (around 09/28/2024) for HTN, DM, Hypothyroidism, hyperlipidemia (fasting).     Roselie Mood, NP

## 2024-03-31 NOTE — Assessment & Plan Note (Signed)
 BP at goal with amlodipine  and spironolactone  BP Readings from Last 3 Encounters:  03/31/24 126/78  09/24/23 126/72  05/18/23 124/76    Maintain med doses Repeat CMP F/up in 6months

## 2024-03-31 NOTE — Assessment & Plan Note (Signed)
 No use of med at this time, asymptomatic Repeat Tsh and T4

## 2024-03-31 NOTE — Patient Instructions (Signed)
 Go to lab Maintain Heart healthy diet and daily exercise. Maintain current medications.

## 2024-03-31 NOTE — Assessment & Plan Note (Signed)
Repeat lipid panel °Maintain lipitor dose °

## 2024-03-31 NOTE — Assessment & Plan Note (Signed)
 Controlled with ozempic  0.5mg  and metformin  Resolved constipation BP at goal Normal foot exam today, no neuropathy. Current use of statin  Repeat hgbA1c, CMP, and lipid panel Maintain med doses Advised to schedule annual DIABETES eye exam F/up in 6months

## 2024-03-31 NOTE — Assessment & Plan Note (Signed)
 Use of OVER THE COUNTER dose 2000Iu daily Repeat vit. D

## 2024-04-01 ENCOUNTER — Other Ambulatory Visit (HOSPITAL_BASED_OUTPATIENT_CLINIC_OR_DEPARTMENT_OTHER): Payer: Self-pay

## 2024-04-01 ENCOUNTER — Ambulatory Visit: Payer: Self-pay | Admitting: Nurse Practitioner

## 2024-04-01 DIAGNOSIS — E559 Vitamin D deficiency, unspecified: Secondary | ICD-10-CM

## 2024-04-01 DIAGNOSIS — E876 Hypokalemia: Secondary | ICD-10-CM

## 2024-04-01 DIAGNOSIS — R748 Abnormal levels of other serum enzymes: Secondary | ICD-10-CM

## 2024-04-01 DIAGNOSIS — E1169 Type 2 diabetes mellitus with other specified complication: Secondary | ICD-10-CM

## 2024-04-01 DIAGNOSIS — E282 Polycystic ovarian syndrome: Secondary | ICD-10-CM

## 2024-04-01 MED ORDER — POTASSIUM CHLORIDE CRYS ER 20 MEQ PO TBCR
40.0000 meq | EXTENDED_RELEASE_TABLET | Freq: Every day | ORAL | 0 refills | Status: AC
  Filled 2024-04-01: qty 10, 5d supply, fill #0

## 2024-04-01 MED ORDER — VITAMIN D3 125 MCG (5000 UT) PO CAPS: 5000.0000 [IU] | ORAL_CAPSULE | Freq: Every day | ORAL | Status: AC

## 2024-04-01 MED ORDER — METFORMIN HCL ER 750 MG PO TB24
750.0000 mg | ORAL_TABLET | Freq: Two times a day (BID) | ORAL | 1 refills | Status: AC
  Filled 2024-04-01: qty 180, 90d supply, fill #0

## 2024-04-01 MED ORDER — ATORVASTATIN CALCIUM 40 MG PO TABS
40.0000 mg | ORAL_TABLET | Freq: Every evening | ORAL | 1 refills | Status: AC
  Filled 2024-04-01 – 2024-07-01 (×2): qty 90, 90d supply, fill #0

## 2024-04-01 NOTE — Assessment & Plan Note (Signed)
 increase in hgbA1c: 6.3 to 7.1%: increase metformin  dose to 750mg  BID ALP-liver enzyme remains elevated. Entered order for ABDOMEN US . F/up in 3months instead of 6months (fasting)

## 2024-04-01 NOTE — Assessment & Plan Note (Signed)
 LDL remains above target: increase atorvastatin  dose to 40mg . New prescription sent. Maintain a mediterranean diet Increase in hgbA1c: 6.3 to 7.1%: increase metformin  dose to 750mg  BID ALP-liver enzyme remains elevated. Entered order for ABDOMEN US . F/up in 3months instead of 6months (fasting)

## 2024-04-11 ENCOUNTER — Other Ambulatory Visit (HOSPITAL_BASED_OUTPATIENT_CLINIC_OR_DEPARTMENT_OTHER): Payer: Self-pay

## 2024-06-09 ENCOUNTER — Other Ambulatory Visit: Payer: Self-pay

## 2024-06-09 ENCOUNTER — Other Ambulatory Visit: Payer: Self-pay | Admitting: Nurse Practitioner

## 2024-06-09 ENCOUNTER — Other Ambulatory Visit (HOSPITAL_BASED_OUTPATIENT_CLINIC_OR_DEPARTMENT_OTHER): Payer: Self-pay

## 2024-06-09 DIAGNOSIS — B009 Herpesviral infection, unspecified: Secondary | ICD-10-CM

## 2024-06-09 MED ORDER — VALACYCLOVIR HCL 500 MG PO TABS
500.0000 mg | ORAL_TABLET | Freq: Two times a day (BID) | ORAL | 0 refills | Status: AC
  Filled 2024-06-09: qty 14, 7d supply, fill #0

## 2024-06-13 ENCOUNTER — Ambulatory Visit: Payer: Self-pay | Admitting: Nurse Practitioner

## 2024-06-13 ENCOUNTER — Ambulatory Visit
Admission: RE | Admit: 2024-06-13 | Discharge: 2024-06-13 | Disposition: A | Discharge status: Home / Self Care | Source: Ambulatory Visit | Attending: Nurse Practitioner | Admitting: Nurse Practitioner

## 2024-06-13 DIAGNOSIS — R748 Abnormal levels of other serum enzymes: Secondary | ICD-10-CM

## 2024-06-13 DIAGNOSIS — K769 Liver disease, unspecified: Secondary | ICD-10-CM

## 2024-06-13 DIAGNOSIS — R160 Hepatomegaly, not elsewhere classified: Secondary | ICD-10-CM | POA: Insufficient documentation

## 2024-06-13 DIAGNOSIS — K7689 Other specified diseases of liver: Secondary | ICD-10-CM | POA: Diagnosis not present

## 2024-06-17 ENCOUNTER — Encounter: Payer: Self-pay | Admitting: Nurse Practitioner

## 2024-07-01 ENCOUNTER — Other Ambulatory Visit (HOSPITAL_BASED_OUTPATIENT_CLINIC_OR_DEPARTMENT_OTHER): Payer: Self-pay

## 2024-07-01 ENCOUNTER — Other Ambulatory Visit: Payer: Self-pay

## 2024-07-01 ENCOUNTER — Other Ambulatory Visit: Payer: Self-pay | Admitting: Nurse Practitioner

## 2024-07-01 DIAGNOSIS — K219 Gastro-esophageal reflux disease without esophagitis: Secondary | ICD-10-CM

## 2024-07-01 MED ORDER — PANTOPRAZOLE SODIUM 20 MG PO TBEC
20.0000 mg | DELAYED_RELEASE_TABLET | Freq: Every day | ORAL | 1 refills | Status: AC
  Filled 2024-07-01: qty 90, 90d supply, fill #0

## 2024-07-03 ENCOUNTER — Telehealth: Payer: Self-pay | Admitting: Nurse Practitioner

## 2024-07-03 ENCOUNTER — Other Ambulatory Visit (HOSPITAL_BASED_OUTPATIENT_CLINIC_OR_DEPARTMENT_OTHER): Payer: Self-pay

## 2024-07-03 DIAGNOSIS — F40240 Claustrophobia: Secondary | ICD-10-CM

## 2024-07-03 MED ORDER — ALPRAZOLAM 0.5 MG PO TABS
0.5000 mg | ORAL_TABLET | Freq: Every day | ORAL | 0 refills | Status: AC | PRN
  Filled 2024-07-03: qty 2, 2d supply, fill #0

## 2024-07-03 NOTE — Telephone Encounter (Signed)
 Patient called and informed that medication was sent to pharmacy. She thanked me for calling and all (if any) questions were answered.

## 2024-07-03 NOTE — Telephone Encounter (Signed)
 Pt called asking if she could get medication for anxiety for her MRI scheduled for tomorrow.

## 2024-07-04 ENCOUNTER — Ambulatory Visit
Admission: RE | Admit: 2024-07-04 | Discharge: 2024-07-04 | Disposition: A | Source: Ambulatory Visit | Attending: Nurse Practitioner

## 2024-07-04 DIAGNOSIS — R160 Hepatomegaly, not elsewhere classified: Secondary | ICD-10-CM

## 2024-07-04 DIAGNOSIS — K8020 Calculus of gallbladder without cholecystitis without obstruction: Secondary | ICD-10-CM | POA: Diagnosis not present

## 2024-07-04 MED ORDER — GADOPICLENOL 0.5 MMOL/ML IV SOLN
9.0000 mL | Freq: Once | INTRAVENOUS | Status: AC | PRN
  Administered 2024-07-04: 9 mL via INTRAVENOUS

## 2024-07-07 ENCOUNTER — Ambulatory Visit: Payer: Self-pay | Admitting: Nurse Practitioner

## 2024-07-07 DIAGNOSIS — R748 Abnormal levels of other serum enzymes: Secondary | ICD-10-CM

## 2024-07-07 DIAGNOSIS — K7689 Other specified diseases of liver: Secondary | ICD-10-CM

## 2024-07-07 DIAGNOSIS — R160 Hepatomegaly, not elsewhere classified: Secondary | ICD-10-CM

## 2024-07-09 ENCOUNTER — Encounter: Payer: Self-pay | Admitting: Obstetrics

## 2024-07-09 ENCOUNTER — Ambulatory Visit: Admitting: Obstetrics

## 2024-07-09 ENCOUNTER — Other Ambulatory Visit (HOSPITAL_BASED_OUTPATIENT_CLINIC_OR_DEPARTMENT_OTHER): Payer: Self-pay

## 2024-07-09 VITALS — BP 143/94 | HR 101

## 2024-07-09 DIAGNOSIS — N952 Postmenopausal atrophic vaginitis: Secondary | ICD-10-CM | POA: Insufficient documentation

## 2024-07-09 DIAGNOSIS — R351 Nocturia: Secondary | ICD-10-CM | POA: Insufficient documentation

## 2024-07-09 DIAGNOSIS — R8290 Unspecified abnormal findings in urine: Secondary | ICD-10-CM | POA: Diagnosis not present

## 2024-07-09 DIAGNOSIS — K5909 Other constipation: Secondary | ICD-10-CM | POA: Diagnosis not present

## 2024-07-09 DIAGNOSIS — N812 Incomplete uterovaginal prolapse: Secondary | ICD-10-CM | POA: Diagnosis not present

## 2024-07-09 DIAGNOSIS — N393 Stress incontinence (female) (male): Secondary | ICD-10-CM | POA: Diagnosis not present

## 2024-07-09 LAB — POCT URINALYSIS DIP (CLINITEK)
Bilirubin, UA: NEGATIVE
Bilirubin, UA: NEGATIVE
Blood, UA: NEGATIVE
Glucose, UA: 500 mg/dL — AB
Glucose, UA: 250 mg/dL — AB
Leukocytes, UA: NEGATIVE
Leukocytes, UA: NEGATIVE
Nitrite, UA: NEGATIVE
Nitrite, UA: NEGATIVE
POC PROTEIN,UA: 30 — AB
POC PROTEIN,UA: 100 — AB
Spec Grav, UA: 1.025 (ref 1.010–1.025)
Spec Grav, UA: 1.025 (ref 1.010–1.025)
Urobilinogen, UA: 1 U/dL
Urobilinogen, UA: 1 U/dL
pH, UA: 7 (ref 5.0–8.0)
pH, UA: 7 (ref 5.0–8.0)

## 2024-07-09 MED ORDER — ESTRADIOL 0.01 % VA CREA
1.0000 | TOPICAL_CREAM | Freq: Every evening | VAGINAL | 3 refills | Status: AC
  Filled 2024-07-09: qty 42.5, 30d supply, fill #0

## 2024-07-09 NOTE — Assessment & Plan Note (Signed)
-   last HbA1C 7.1 in 03/31/24 - no longer using Ozempic  - discussed goal < 8 prior to surgical intervention - discussed importance of glycemic control optimization

## 2024-07-09 NOTE — Addendum Note (Signed)
 Addended by: KRYSTAL ANDREE GAILS on: 07/09/2024 02:04 PM   Modules accepted: Orders

## 2024-07-09 NOTE — Progress Notes (Addendum)
 New Patient Evaluation and Consultation  Referring Provider: Nche, Roselie Rockford, NP PCP: Katheen Roselie Rockford, NP Date of Service: 07/09/2024  SUBJECTIVE Chief Complaint: New Patient (Initial Visit) (Cheryl Welch is a 48 y.o. female here today for female organ prolapse.)  History of Present Illness: Cheryl Welch is a 48 y.o. Black or African-American female seen in consultation at the request of NP Nche for evaluation of pelvic organ prolapse.    Last evaluated by Dr. Marilynne in 04/2021 Vaginal bulge first noted when she was on the toilet, reduced herself without difficulty.  Denies changes in medication, surgery, weight change, falls. Cherry tomato size vaginal bulge noted when she sits daily or when she sits on the commode, discomfort noted intercourse  Denies vaginal bleeding, reports white vaginal discharge since prolapse without itching.  Reports history of constipation since pregnancies, previously managed by Miralax Stopped Ozepmic 1 month ago with improvement of constipation Pending GI referral for difficulty swallowing and fatty liver Difficulty working from home with reduced activity  Review of records significant for: T2DM on metformin , HSV, fatty liver, hemorrhoids, PCOS managed by Dr. Constant  MRI abd w/ and w/o contrast 07/04/24: CLINICAL DATA:  Liver lesion, > 1cm.   EXAM: MRI ABDOMEN WITHOUT AND WITH CONTRAST   TECHNIQUE: Multiplanar multisequence MR imaging of the abdomen was performed both before and after the administration of intravenous contrast.   CONTRAST:  9 mL of Vueway    COMPARISON:  Ultrasound abdomen from 06/13/2024.   FINDINGS: Lower chest: Unremarkable MR appearance to the lung bases. No pleural effusion. No pericardial effusion. Normal heart size.   Hepatobiliary: The liver is moderately enlarged measuring up to 19 cm in length. Noncirrhotic configuration. There are innumerable mildly T1 and T2 hyperintense lesions occupying  approximately 50% of the total liver parenchyma ranging in size from few mm up to 5.9 cm. The lesions exhibit marked arterial hyperenhancement without washout on delayed/equilibrium phase images. No abnormal diffusion restriction on high B value images. There is a lesion in the right hepatic lobe at the junction of segments 7/8 (series 5-1, image 14), which exhibits microscopic fat as seen as loss of signal on out of phase images, in comparison to the in phase images. Several medium sized lesions exhibit central T2 hyperintense scar which exhibit delayed enhancement (for example, series 4, 19 and series 11-18, image 38). Overall, these are favored to represent focal nodular hyperplasia.   No other focal liver lesion. No intrahepatic or extrahepatic bile duct dilatation. No choledocholithiasis. Physiologically distended gallbladder containing small volume gallstones without imaging signs of acute cholecystitis.   Pancreas: No mass, inflammatory changes or other parenchymal abnormality identified. No main pancreatic duct dilation.   Spleen:  Within normal limits in size and appearance. No focal mass.   Adrenals/Urinary Tract: Unremarkable adrenal glands. No hydroureteronephrosis. No suspicious renal mass.   Stomach/Bowel: Visualized portions within the abdomen are unremarkable. No disproportionate dilation of bowel loops. Unremarkable appendix.   Vascular/Lymphatic: No pathologically enlarged lymph nodes identified. No abdominal aortic aneurysm demonstrated. No ascites.   Other:  None.   Musculoskeletal: No suspicious bone lesions identified.   IMPRESSION: 1. There are innumerable lesions throughout the liver, favored to represent focal nodular hyperplasia. Recommend follow-up MRI abdomen with Eovist contrast in 6 months. 2. There is moderate hepatomegaly. 3. Cholelithiasis without acute cholecystitis.     Electronically Signed   By: Ree Molt M.D.   On: 07/05/2024  12:35  Urinary Symptoms: Leaks urine with cough/ sneeze started around  3 years ago Leaks 3-4 time(s) per days.  Pad use: 1 liners/ mini-pads per day.   Patient is not bothered by UI symptoms.  Day time voids 4-6.  Nocturia: 2 times per night to void started 3 years ago Reports intermittent snoring, denies OSA Denies leg swelling Stops fluid intake around 7-8pm, sleeps around 10pm Voiding dysfunction:  empties bladder well.  Patient does not use a catheter to empty bladder.  When urinating, patient feels she has no difficulties Drinks: 64-80oz water per day, 10oz sweet tea at dinner 2x/week with increased night time frequency, soda 1x/month  UTIs: 0 UTI's in the last year.   Denies history of kidney or bladder stones, pyelonephritis, bladder cancer, and kidney cancer History of microscopic hematuria with workup due to family history of kidney cancer  with referral to nephrology by Lauraine Moose, PA (PCP) in 09/03/15. Denies renal imaging or procedures. No results found for the last 90 days.   Pelvic Organ Prolapse Symptoms:                  Patient Admits to a feeling of a bulge the vaginal area. It has been present for 3 years.  Patient Admits to seeing a bulge.  This bulge is bothersome.  Bowel Symptom: Bowel movements: 3 time(s) per week Stool consistency: hard, Type III-IV stool Straining: no.  Splinting: no.  Incomplete evacuation: no.  Patient Denies accidental bowel leakage / fecal incontinence Bowel regimen: stool softener PRN Last colonoscopy: none. Last Cologuard negative 02/24/23 [facility] Colonoscopy   This patient has no relevant Health Maintenance data.     Sexual Function Sexually active: yes.  Sexual orientation: Straight Pain with sex: Yes, has discomfort due to prolapse vs. Dryness   Pelvic Pain Denies pelvic pain  Past Medical History:  Past Medical History:  Diagnosis Date   Abnormal Pap smear    2004; LGSIL??>colpo>nml   Abnormal thyroid  function test     Allergy    Arthritis    Diabetes mellitus without complication (HCC)    DM (diabetes mellitus) (HCC) 10/17/2017   Encounter for refill of prescription for contraception 08/01/2022   Fatty liver    GERD (gastroesophageal reflux disease)    Hematuria 09/03/2015   Herpes simplex 09/03/2015   Hirsutism    HSV infection    HTN (hypertension) 07/05/2021   Hyperlipidemia associated with type 2 diabetes mellitus (HCC) 08/17/2022   Hypothyroidism 07/20/2015   Left flank pain 10/17/2017   Lesion of liver    PCO (polycystic ovaries)    Prediabetes      Past Surgical History:   Past Surgical History:  Procedure Laterality Date   CESAREAN SECTION  07/24/1992     Past OB/GYN History: OB History  Gravida Para Term Preterm AB Living  2 2 2   2   SAB IAB Ectopic Multiple Live Births      2    # Outcome Date GA Lbr Len/2nd Weight Sex Type Anes PTL Lv  2 Term 02/16/01 [redacted]w[redacted]d 24:00 5 lb 12 oz (2.608 kg) F VBAC EPI  LIV  1 Term 06/29/93 [redacted]w[redacted]d 06:00 7 lb 6.5 oz (3.359 kg) M CS-Classical EPI  LIV    Vaginal deliveries: 1,  Forceps/ Vacuum deliveries: 0, Cesarean section: 1 Menopausal: No, LMP No LMP recorded. (Menstrual status: Oral contraceptives). Last cycle 16 years ago Contraception: continuous OCPs managed by Dr. Alger Last pap smear.  Any history of abnormal pap smears: no.    Component Value Date/Time   DIAGPAP  11/01/2020 1340    - Negative for intraepithelial lesion or malignancy (NILM)   HPVHIGH Negative 11/01/2020 1340   ADEQPAP  11/01/2020 1340    Satisfactory for evaluation; transformation zone component ABSENT.    Medications: Patient has a current medication list which includes the following prescription(s): alprazolam , amlodipine , atorvastatin , vitamin d3, [START ON 07/10/2024] estradiol , levonorgestrel -ethinyl estradiol , metformin , pantoprazole , potassium chloride  sa, spironolactone , and valacyclovir .   Allergies: Patient has no known allergies.   Social  History: Social History[1]  Relationship status: married Patient lives with her husband.   Patient is employed. Regular exercise: No History of abuse: No  Family History:   Family History  Problem Relation Age of Onset   Hypertension Mother    Endometrial cancer Mother    Renal cancer Father    Hypertension Father    Colon polyps Father    Other Father        esophageal stricture   Diabetes Maternal Grandmother    Stroke Maternal Grandmother    Colon cancer Neg Hx    Esophageal cancer Neg Hx    Breast cancer Neg Hx    Uterine cancer Neg Hx    Bladder Cancer Neg Hx      Review of Systems: Review of Systems  Constitutional:  Negative for fever, malaise/fatigue and weight loss.  Respiratory:  Negative for cough, shortness of breath and wheezing.   Cardiovascular:  Negative for chest pain, palpitations and leg swelling.  Gastrointestinal:  Negative for abdominal pain, blood in stool and constipation.  Genitourinary:  Positive for frequency. Negative for dysuria, hematuria and urgency.       Discharge, bulge, leakage  Skin:  Negative for rash.  Neurological:  Negative for dizziness, weakness and headaches.  Endo/Heme/Allergies:  Does not bruise/bleed easily.  Psychiatric/Behavioral:  Negative for depression. The patient is not nervous/anxious.      OBJECTIVE Physical Exam: Vitals:   07/09/24 0805  BP: (!) 143/94  Pulse: (!) 101   Physical Exam Constitutional:      General: She is not in acute distress.    Appearance: Normal appearance.  Genitourinary:     Bladder and urethral meatus normal.     No lesions in the vagina.     Right Labia: No rash, tenderness, lesions, skin changes or Bartholin's cyst.    Left Labia: No tenderness, lesions, skin changes, Bartholin's cyst or rash.    No vaginal discharge, erythema, tenderness, bleeding, ulceration or granulation tissue.     Anterior, posterior and apical vaginal prolapse present.    Mild vaginal atrophy present.      Right Adnexa: not tender, not full and no mass present.    Left Adnexa: not tender, not full and no mass present.    No cervical motion tenderness, discharge, friability, lesion, polyp or nabothian cyst.     Uterus is enlarged, irregular (posterior wall consistent with fibroids) and prolapsed.     Uterus is not fixed or tender.     No uterine mass detected.    Urethral meatus caruncle not present.    No urethral prolapse, tenderness, mass, hypermobility, discharge or stress urinary incontinence with cough stress test present.     Bladder is not tender, urgency on palpation not present and masses not present.      Pelvic Floor: Levator muscle strength is 4/5.    Levator ani not tender, obturator internus not tender, no asymmetrical contractions present and no pelvic spasms present.    Symmetrical pelvic sensation, anal wink present and  BC reflex present. Cardiovascular:     Rate and Rhythm: Normal rate.  Pulmonary:     Effort: Pulmonary effort is normal. No respiratory distress.  Abdominal:     General: There is no distension.     Palpations: Abdomen is soft. There is no mass.     Tenderness: There is no abdominal tenderness.     Hernia: No hernia is present.   Neurological:     Mental Status: She is alert.  Vitals reviewed. Exam conducted with a chaperone present.      POP-Q:   POP-Q  0                                            Aa   0                                           Ba  3                                              C   4                                            Gh  4                                            Pb  9                                            tvl   0                                            Ap  0                                            Bp  0                                              D     Post-Void Residual (PVR) by Bladder Scan: In order to evaluate bladder emptying, we discussed obtaining a postvoid residual and patient  agreed to this procedure.  Procedure: The ultrasound unit was placed on the patient's abdomen in the suprapubic region after the patient had voided.    Post Void Residual - 07/09/24 0910       Post Void Residual   Post Void Residual 19 mL  Straight Catheterization Procedure for PVR: After verbal consent was obtained from the patient for catheterization to assess bladder emptying and residual volume the urethra and surrounding tissues were prepped with betadine and an in and out catheterization was performed.  PVR was 80mL.  Urine appeared clear yellow. The patient tolerated the procedure well.    Laboratory Results: Lab Results  Component Value Date   COLORU yellow 07/09/2024   CLARITYU clear 07/09/2024   GLUCOSEUR =250 (A) 07/09/2024   BILIRUBINUR negative 07/09/2024   KETONESU Negative 05/13/2021   SPECGRAV 1.025 07/09/2024   RBCUR negative 07/09/2024   PHUR 7.0 07/09/2024   PROTEINUR Positive (A) 05/13/2021   UROBILINOGEN 1.0 07/09/2024   LEUKOCYTESUR Negative 07/09/2024    Lab Results  Component Value Date   CREATININE 0.73 03/31/2024   CREATININE 0.70 09/24/2023   CREATININE 0.67 05/18/2023    Lab Results  Component Value Date   HGBA1C 7.1 (H) 03/31/2024    Lab Results  Component Value Date   HGB 13.6 10/18/2017     ASSESSMENT AND PLAN Ms. Peale is a 48 y.o. with:  1. Other constipation   2. Stress incontinence   3. Uterovaginal prolapse, incomplete   4. Vaginal atrophy   5. Nocturia   6. Abnormal urinalysis     Other constipation Assessment & Plan: - since pregnancy, prior use of Miralax - improved since stopping Ozempic  - For constipation, we reviewed the importance of a better bowel regimen.  We also discussed the importance of avoiding chronic straining, as it can exacerbate her pelvic floor symptoms; we discussed treating constipation and straining prior to surgery, as postoperative straining can lead to damage to the repair and  recurrence of symptoms. We discussed initiating therapy with increasing fluid intake, fiber supplementation, stool softeners, and laxatives such as miralax.  - encouraged squatting position for bowel movements and titration of fiber supplementation to optimize stool consistency and minimize straining    Stress incontinence Assessment & Plan: - POCT UA clean catch + heme/glucose, catheterized UA negative - PVR 19mL - For treatment of stress urinary incontinence,  non-surgical options include expectant management, weight loss, physical therapy, as well as a pessary.  Surgical options include a midurethral sling, Burch urethropexy, and transurethral injection of a bulking agent. - encouraged to continue weight loss, Kegel exercises and optimize glucose control - unsure about pessary use due to need for self management - discussed office procedure with urethral bulking (Bulkamid). We discussed success rate of approximately 70-80% and possible need for second injection. We reviewed that this is not a permanent procedure and the Bulkamid does become less effective over time. Risks reviewed including injury to bladder or urethra, UTI, urinary retention and hematuria.  - Sling: The effectiveness of a midurethral vaginal mesh sling is approximately 85%, and thus, there will be times when you may leak urine after surgery, especially if your bladder is full or if you have a strong cough. There is a balance between making the sling tight enough to treat your leakage but not too tight so that you have long-term difficulty emptying your bladder. A mesh sling will not directly treat overactive bladder/urge incontinence and may worsen it.  There is an FDA safety notification on vaginal mesh procedures for prolapse but NOT mesh slings. We have extensive experience and training with mesh placement and we have close postoperative follow up to identify any potential complications from mesh. It is important to realize that  this mesh is a permanent implant that cannot  be easily removed. There are rare risks of mesh exposure (2-4%), pain with intercourse (0-7%), and infection (<1%). The risk of mesh exposure if more likely in a woman with risks for poor healing (prior radiation, poorly controlled diabetes, or immunocompromised). The risk of new or worsened chronic pain after mesh implant is more common in women with baseline chronic pain and/or poorly controlled anxiety or depression. Approximately 2-4% of patients will experience longer-term post-operative voiding dysfunction that may require surgical revision of the sling. We also reviewed that postoperatively, her stream may not be as strong as before surgery.  - reviewed urodynamic testing prior to surgical intervention if desired  Orders: -     POCT URINALYSIS DIP (CLINITEK) -     Estradiol ; Place 0.5 grams vaginally nightly for two weeks THEN twice a week thereafter.  Dispense: 30 g; Refill: 3  Uterovaginal prolapse, incomplete Assessment & Plan: - evaluated in 2022 by Dr. Marilynne - For treatment of pelvic organ prolapse, we discussed options for management including expectant management, conservative management, and surgical management, such as Kegels, a pessary, pelvic floor physical therapy, and specific surgical procedures. - We discussed two options for prolapse repair:  1) vaginal repair without mesh - Pros - safer, no mesh complications - Cons - not as strong as mesh repair, higher risk of recurrence  2) laparoscopic repair with mesh - Pros - stronger, better long-term success - Cons - risks of mesh implant (erosion into vagina or bladder, adhering to the rectum, pain) - these risks are lower than with a vaginal mesh but still exist - unsure about pessary use or mesh use, reviewed risks and benefits and pt desires to consider options - TVUS ordered due to fullness noted from pelvic exam in the posterior cul-de-sac, pt to call after imaging for  follow-up  Orders: -     US  PELVIS TRANSVAGINAL NON-OB (TV ONLY); Future -     Estradiol ; Place 0.5 grams vaginally nightly for two weeks THEN twice a week thereafter.  Dispense: 30 g; Refill: 3  Vaginal atrophy Assessment & Plan: - vaginal dryness with discomfort during intercourse - For symptomatic vaginal atrophy options include lubrication with a water-based lubricant, personal hygiene measures and barrier protection against wetness, and estrogen replacement in the form of vaginal cream, vaginal tablets, or a time-released vaginal ring.   - Rx to start low dose vaginal estrogen  Orders: -     Estradiol ; Place 0.5 grams vaginally nightly for two weeks THEN twice a week thereafter.  Dispense: 30 g; Refill: 3  Nocturia Assessment & Plan: - avoid fluid intake 3 hours before bedtime - switch diuretic (e.g. spironolactone ) dosing to earlier in the evening - due to snoring, consider workup for sleep apnea    Abnormal urinalysis -     POCT URINALYSIS DIP (CLINITEK)  Encouraged to follow-up with Dr. Alger due to [facility] management due to PCOS and T2DM.   Time spent: I spent 69 minutes dedicated to the care of this patient on the date of this encounter to include pre-visit review of records, face-to-face time with the patient discussing stage III pelvic organ prolapse, vaginal atrophy, nocturia, stress urinary incontinence, constipation, and post visit documentation and ordering medication/ testing.   Lianne ONEIDA Gillis, MD         [1]  Social History Tobacco Use   Smoking status: Never   Smokeless tobacco: Never  Vaping Use   Vaping status: Never Used  Substance Use Topics   Alcohol use: Yes  Comment: occassionally   Drug use: No

## 2024-07-09 NOTE — Assessment & Plan Note (Signed)
-   vaginal dryness with discomfort during intercourse - For symptomatic vaginal atrophy options include lubrication with a water-based lubricant, personal hygiene measures and barrier protection against wetness, and estrogen replacement in the form of vaginal cream, vaginal tablets, or a time-released vaginal ring.   - Rx to start low dose vaginal estrogen

## 2024-07-09 NOTE — Patient Instructions (Addendum)
 You have a stage 3 (out of 4) prolapse.  We discussed the fact that it is not life threatening but there are several treatment options. For treatment of pelvic organ prolapse, we discussed options for management including expectant management, conservative management, and surgical management, such as Kegels, a pessary, pelvic floor physical therapy, and specific surgical procedures.     For treatment of stress urinary incontinence, which is leakage with physical activity/movement/strainging/coughing, we discussed expectant management versus nonsurgical options versus surgery. Nonsurgical options include weight loss, physical therapy, as well as a pessary.  Surgical options include a midurethral sling, which is a synthetic mesh sling that acts like a hammock under the urethra to prevent leakage of urine, a Burch urethropexy, and transurethral injection of a bulking agent.   For night time frequency: - avoid fluid intake 3 hours before bedtime - switch your diuretic (e.g. spironolactone ) dosing to 2pm - due to snoring, consider workup for sleep apnea  For vaginal atrophy (thinning of the vaginal tissue that can cause dryness and burning) and UTI prevention we discussed estrogen replacement in the form of vaginal cream.   Start vaginal estrogen therapy nightly for two weeks then 2 times weekly at night. This can be placed with your finger or an applicator inside the vagina and around the urethra.  Please let us  know if the prescription is too expensive and we can look for alternative options.   Is vaginal estrogen therapy safe for me? Vaginal estrogen preparations act on the vaginal skin, and only a very tiny amount is absorbed into the bloodstream (0.01%).  They work in a similar way to hand or face cream.  There is minimal absorption and they are therefore perfectly safe. If you have had breast cancer and have persistent troublesome symptoms which aren't settling with vaginal moisturisers and  lubricants, local estrogen treatment may be a possibility, but consultation with your oncologist should take place first.   Please call radiology at [phone] to schedule your imaging study today

## 2024-07-09 NOTE — Assessment & Plan Note (Signed)
-   since pregnancy, prior use of Miralax - improved since stopping Ozempic  - For constipation, we reviewed the importance of a better bowel regimen.  We also discussed the importance of avoiding chronic straining, as it can exacerbate her pelvic floor symptoms; we discussed treating constipation and straining prior to surgery, as postoperative straining can lead to damage to the repair and recurrence of symptoms. We discussed initiating therapy with increasing fluid intake, fiber supplementation, stool softeners, and laxatives such as miralax.  - encouraged squatting position for bowel movements and titration of fiber supplementation to optimize stool consistency and minimize straining

## 2024-07-09 NOTE — Assessment & Plan Note (Addendum)
-   evaluated in 2022 by Dr. Marilynne - For treatment of pelvic organ prolapse, we discussed options for management including expectant management, conservative management, and surgical management, such as Kegels, a pessary, pelvic floor physical therapy, and specific surgical procedures. - We discussed two options for prolapse repair:  1) vaginal repair without mesh - Pros - safer, no mesh complications - Cons - not as strong as mesh repair, higher risk of recurrence  2) laparoscopic repair with mesh - Pros - stronger, better long-term success - Cons - risks of mesh implant (erosion into vagina or bladder, adhering to the rectum, pain) - these risks are lower than with a vaginal mesh but still exist - unsure about pessary use or mesh use, reviewed risks and benefits and pt desires to consider options - TVUS ordered due to fullness noted from pelvic exam in the posterior cul-de-sac, pt to call after imaging for follow-up

## 2024-07-09 NOTE — Assessment & Plan Note (Signed)
-   POCT UA clean catch + heme/glucose, catheterized UA negative - PVR 19mL - For treatment of stress urinary incontinence,  non-surgical options include expectant management, weight loss, physical therapy, as well as a pessary.  Surgical options include a midurethral sling, Burch urethropexy, and transurethral injection of a bulking agent. - encouraged to continue weight loss, Kegel exercises and optimize glucose control - unsure about pessary use due to need for self management - discussed office procedure with urethral bulking (Bulkamid). We discussed success rate of approximately 70-80% and possible need for second injection. We reviewed that this is not a permanent procedure and the Bulkamid does become less effective over time. Risks reviewed including injury to bladder or urethra, UTI, urinary retention and hematuria.  - Sling: The effectiveness of a midurethral vaginal mesh sling is approximately 85%, and thus, there will be times when you may leak urine after surgery, especially if your bladder is full or if you have a strong cough. There is a balance between making the sling tight enough to treat your leakage but not too tight so that you have long-term difficulty emptying your bladder. A mesh sling will not directly treat overactive bladder/urge incontinence and may worsen it.  There is an FDA safety notification on vaginal mesh procedures for prolapse but NOT mesh slings. We have extensive experience and training with mesh placement and we have close postoperative follow up to identify any potential complications from mesh. It is important to realize that this mesh is a permanent implant that cannot be easily removed. There are rare risks of mesh exposure (2-4%), pain with intercourse (0-7%), and infection (<1%). The risk of mesh exposure if more likely in a woman with risks for poor healing (prior radiation, poorly controlled diabetes, or immunocompromised). The risk of new or worsened chronic pain  after mesh implant is more common in women with baseline chronic pain and/or poorly controlled anxiety or depression. Approximately 2-4% of patients will experience longer-term post-operative voiding dysfunction that may require surgical revision of the sling. We also reviewed that postoperatively, her stream may not be as strong as before surgery.  - reviewed urodynamic testing prior to surgical intervention if desired

## 2024-07-09 NOTE — Assessment & Plan Note (Signed)
-   avoid fluid intake 3 hours before bedtime - switch diuretic (e.g. spironolactone ) dosing to earlier in the evening - due to snoring, consider workup for sleep apnea

## 2024-07-22 ENCOUNTER — Other Ambulatory Visit (HOSPITAL_BASED_OUTPATIENT_CLINIC_OR_DEPARTMENT_OTHER): Payer: Self-pay

## 2024-08-04 ENCOUNTER — Encounter: Payer: Self-pay | Admitting: *Deleted

## 2024-08-15 ENCOUNTER — Other Ambulatory Visit (HOSPITAL_BASED_OUTPATIENT_CLINIC_OR_DEPARTMENT_OTHER): Payer: Self-pay

## 2024-08-15 ENCOUNTER — Ambulatory Visit: Admitting: Family Medicine

## 2024-08-15 VITALS — BP 151/102 | HR 113 | Temp 98.2°F | Ht 67.0 in | Wt 197.4 lb

## 2024-08-15 DIAGNOSIS — B9789 Other viral agents as the cause of diseases classified elsewhere: Secondary | ICD-10-CM

## 2024-08-15 DIAGNOSIS — B09 Unspecified viral infection characterized by skin and mucous membrane lesions: Secondary | ICD-10-CM | POA: Diagnosis not present

## 2024-08-15 DIAGNOSIS — I10 Essential (primary) hypertension: Secondary | ICD-10-CM

## 2024-08-15 DIAGNOSIS — J988 Other specified respiratory disorders: Secondary | ICD-10-CM | POA: Diagnosis not present

## 2024-08-15 MED ORDER — FLUTICASONE PROPIONATE 50 MCG/ACT NA SUSP
2.0000 | Freq: Every day | NASAL | 6 refills | Status: AC
  Filled 2024-08-15: qty 16, 30d supply, fill #0

## 2024-08-15 MED ORDER — PROMETHAZINE-DM 6.25-15 MG/5ML PO SYRP
5.0000 mL | ORAL_SOLUTION | Freq: Four times a day (QID) | ORAL | 0 refills | Status: AC | PRN
  Filled 2024-08-15: qty 118, 6d supply, fill #0

## 2024-08-15 MED ORDER — HYDROXYZINE HCL 50 MG PO TABS
50.0000 mg | ORAL_TABLET | Freq: Three times a day (TID) | ORAL | 0 refills | Status: AC | PRN
  Filled 2024-08-15: qty 90, 30d supply, fill #0

## 2024-08-15 NOTE — Patient Instructions (Addendum)
 It was very nice to see you today!  VISIT SUMMARY: Today, you were seen for a rash and sinus congestion. The rash started two days ago and has spread to your upper torso and arms. You also have sinus congestion with associated symptoms like cough, headache, and nasal drainage. Your blood pressure was slightly elevated, likely due to stress and discomfort.  YOUR PLAN: VIRAL EXANTHEM: You have a maculopapular rash on your arms and abdomen, likely due to a viral infection. The rash is itchy and red but does not involve your face or legs. -Take hydroxyzine  for nighttime itching, up to 50 mg three times a day as needed. -Use over-the-counter antihistamines like Zyrtec or Claritin up to four times a day during the day. -Apply topical soothing agents like calamine lotion to relieve itching. -Avoid scratching to prevent further irritation.  ACUTE VIRAL UPPER RESPIRATORY INFECTION: You have sinus congestion, cough, and mild headache, likely due to a viral upper respiratory infection. Your symptoms are mild and should resolve on their own. -Use Flonase for sinus congestion. -Take Tylenol or ibuprofen  for pain relief, but avoid aspirin. -Take promethazine DM for nighttime cough relief. -Continue taking Mucinex for mucus relief.  HYPERTENSION: Your blood pressure was slightly elevated, likely due to stress and discomfort. -Continue to monitor your blood pressure and follow up as needed.  Return if symptoms worsen or fail to improve.   Take care, Arvella Hummer, MD, MS   PLEASE NOTE:  If you had any lab tests, please let us  know if you have not heard back within a few days. You may see your results on mychart before we have a chance to review them but we will give you a call once they are reviewed by us .   If we ordered any referrals today, please let us  know if you have not heard from their office within the next week.   If you had any urgent prescriptions sent in today, please check with the  pharmacy within an hour of our visit to make sure the prescription was transmitted appropriately.   Please try these tips to maintain a healthy lifestyle:  Eat at least 3 REAL meals and 1-2 snacks per day.  Aim for no more than 5 hours between eating.  If you eat breakfast, please do so within one hour of getting up.   Each meal should contain half fruits/vegetables, one quarter protein, and one quarter carbs (no bigger than a computer mouse)  Cut down on sweet beverages. This includes juice, soda, and sweet tea.   Drink at least 1 glass of water with each meal and aim for at least 8 glasses per day  Exercise at least 150 minutes every week.

## 2024-08-15 NOTE — Progress Notes (Signed)
 " Assessment & Plan   Assessment/Plan:    Problem List Items Addressed This Visit       Cardiovascular and Mediastinum   HTN (hypertension)   Other Visit Diagnoses       Viral respiratory illness    -  Primary   Relevant Medications   hydrOXYzine  (ATARAX ) 50 MG tablet   promethazine -dextromethorphan (PROMETHAZINE -DM) 6.25-15 MG/5ML syrup   fluticasone  (FLONASE ) 50 MCG/ACT nasal spray     Viral exanthem       Relevant Medications   hydrOXYzine  (ATARAX ) 50 MG tablet   promethazine -dextromethorphan (PROMETHAZINE -DM) 6.25-15 MG/5ML syrup   fluticasone  (FLONASE ) 50 MCG/ACT nasal spray           Assessment and Plan Assessment & Plan Viral exanthem Maculopapular rash on arms and abdomen, likely viral in origin, consistent with viral exanthem. Rash is itchy and red, sparing the face and legs. No fever or significant respiratory distress. Differential includes pityriasis rosea, but presentation is more consistent with viral exanthem. Steroids not recommended due to potential side effects and lack of necessity. - Prescribed hydroxyzine  for nighttime itching, up to 50 mg three times a day as needed. - Recommended over-the-counter antihistamines (e.g., Zyrtec or Claritin) up to four times a day during the day. - Advised use of topical soothing agents like calamine lotion to relieve itching. - Educated on avoiding scratching to prevent further irritation.  Acute viral upper respiratory infection Symptoms include sinus congestion, cough, and mild headache. Negative for COVID-19 and influenza. Symptoms suggestive of a viral upper respiratory infection, likely self-limiting. No wheezing, chest pain, or significant shortness of breath. Supportive care is preferred due to mild symptoms and absence of severe complications. - Recommended Flonase  for sinus congestion. - Advised use of Tylenol and ibuprofen  for pain relief, avoiding aspirin. - Prescribed promethazine  DM for nighttime cough  relief. - Continue Mucinex for mucus relief.  Hypertension Mildly elevated blood pressure, possibly exacerbated by stress and discomfort. No acute management changes discussed. - Continue to monitor blood pressure and follow up as needed.        There are no discontinued medications.  Return if symptoms worsen or fail to improve.        Subjective:   Encounter date: 08/15/2024  Cheryl Welch is a 49 y.o. female who has Polycystic ovarian disease; Gastroesophageal reflux disease; Constipation; DM (diabetes mellitus) (HCC); Overweight (BMI 25.0-29.9); Herpes labialis; Vitamin D  deficiency; Alopecia; Functional dyspepsia; Hemorrhoidal skin tag; Subclinical hypothyroidism; HTN (hypertension); Encounter for refill of prescription for contraception; Glenohumeral arthritis, right; Hyperlipidemia associated with type 2 diabetes mellitus (HCC); DDD (degenerative disc disease), cervical; Allergic rhinitis; Shoulder impingement syndrome, left; Acute left-sided low back pain with left-sided sciatica; Uterovaginal prolapse, incomplete; Stress incontinence; Liver masses; Elevated alkaline phosphatase level; Vaginal atrophy; and Nocturia on their problem list..   She  has a past medical history of Abnormal Pap smear, Abnormal thyroid  function test, Allergy, Arthritis, Diabetes mellitus without complication (HCC), DM (diabetes mellitus) (HCC) (10/17/2017), Encounter for refill of prescription for contraception (08/01/2022), Fatty liver, GERD (gastroesophageal reflux disease), Hematuria (09/03/2015), Herpes simplex (09/03/2015), Hirsutism, HSV infection, HTN (hypertension) (07/05/2021), Hyperlipidemia associated with type 2 diabetes mellitus (HCC) (08/17/2022), Hypothyroidism (07/20/2015), Left flank pain (10/17/2017), Lesion of liver, PCO (polycystic ovaries), and Prediabetes..   She presents with chief complaint of Sinus Problem (Pt presents today with Sinus issues. States she took the mucinex  yesterday, and benadryl. States she noticed the rash Wednesday when she got sick ) .   Discussed the use of  AI scribe software for clinical note transcription with the patient, who gave verbal consent to proceed.  History of Present Illness Cheryl Welch is a 49 year old female who presents with a rash and sinus congestion.  Pruritic rash - Onset 2 days ago - Initially appeared on abdomen, now spread to upper torso and arms - No involvement of legs or face - Itchy and persistent despite use of Benadryl and hydrocortisone cream - No improvement with current topical and oral antihistamine therapy  Upper respiratory symptoms - Sinus congestion since Wednesday - Associated cough, headache, and nasal drainage (yellow and brown) - Hoarse voice - Chest burning with cough - No wheezing, chest pain, or shortness of breath - Fever on Wednesday, resolved with Tylenol - Mucinex started yesterday evening with slight improvement in ability to speak  Medication use - Currently taking Mucinex, Benadryl, and over-the-counter cold and flu liquid medicine - Cautious to avoid exceeding recommended doses, especially with antihistamines due to overlap in cold medicine - Hydrocortisone cream applied to rash  Potential exposure - Had grandchildren over for the weekend, suspects possible exposure contributing to current symptoms     ROS  Past Surgical History:  Procedure Laterality Date   CESAREAN SECTION  07/24/1992    Outpatient Medications Prior to Visit  Medication Sig Dispense Refill   ALPRAZolam  (XANAX ) 0.5 MG tablet Take 1 tablet (0.5 mg total) by mouth daily as needed for anxiety. 2 tablet 0   amLODipine  (NORVASC ) 5 MG tablet Take 1 tablet (5 mg total) by mouth daily. 90 tablet 3   atorvastatin  (LIPITOR) 40 MG tablet Take 1 tablet (40 mg total) by mouth every evening. 90 tablet 1   Cholecalciferol (VITAMIN D3) 125 MCG (5000 UT) CAPS Take 1 capsule (5,000 Units total) by mouth daily.      estradiol  (ESTRACE ) 0.01 % CREA vaginal cream Place 0.5 grams vaginally nightly for two weeks THEN twice a week thereafter. 30 g 3   levonorgestrel -ethinyl estradiol  (SEASONALE) 0.15-0.03 MG tablet Take 1 tablet by mouth daily. 91 tablet 3   metFORMIN  (GLUCOPHAGE -XR) 750 MG 24 hr tablet Take 1 tablet (750 mg total) by mouth 2 (two) times daily after a meal. 180 tablet 1   pantoprazole  (PROTONIX ) 20 MG tablet Take 1 tablet (20 mg total) by mouth daily. 90 tablet 1   potassium chloride  SA (KLOR-CON  M) 20 MEQ tablet Take 2 tablets (40 mEq total) by mouth daily. 10 tablet 0   spironolactone  (ALDACTONE ) 100 MG tablet Take 1 tablet (100 mg total) by mouth daily. 90 tablet 1   valACYclovir  (VALTREX ) 500 MG tablet Take 1 tablet (500 mg total) by mouth 2 (two) times daily. 14 tablet 0   No facility-administered medications prior to visit.    Family History  Problem Relation Age of Onset   Hypertension Mother    Endometrial cancer Mother    Renal cancer Father    Hypertension Father    Colon polyps Father    Other Father        esophageal stricture   Diabetes Maternal Grandmother    Stroke Maternal Grandmother    Colon cancer Neg Hx    Esophageal cancer Neg Hx    Breast cancer Neg Hx    Uterine cancer Neg Hx    Bladder Cancer Neg Hx     Social History   Socioeconomic History   Marital status: Married    Spouse name: Not on file   Number of children: 2   Years  of education: Not on file   Highest education level: Bachelor's degree (e.g., BA, AB, BS)  Occupational History   Occupation: Producer, Television/film/video: [place]  Tobacco Use   Smoking status: Never   Smokeless tobacco: Never  Vaping Use   Vaping status: Never Used  Substance and Sexual Activity   Alcohol use: Yes    Comment: occassionally   Drug use: No   Sexual activity: Yes    Partners: Male    Birth control/protection: Pill  Other Topics Concern   Not on file  Social History Narrative   Not on file   Social  Drivers of Health   Tobacco Use: Low Risk (07/09/2024)   Patient History    Smoking Tobacco Use: Never    Smokeless Tobacco Use: Never    Passive Exposure: Not on file  Financial Resource Strain: Low Risk (08/15/2024)   Overall Financial Resource Strain (CARDIA)    Difficulty of Paying Living Expenses: Not hard at all  Food Insecurity: No Food Insecurity (08/15/2024)   Epic    Worried About Radiation Protection Practitioner of Food in the Last Year: Never true    Ran Out of Food in the Last Year: Never true  Transportation Needs: No Transportation Needs (08/15/2024)   Epic    Lack of Transportation (Medical): No    Lack of Transportation (Non-Medical): No  Physical Activity: Insufficiently Active (08/15/2024)   Exercise Vital Sign    Days of Exercise per Week: 1 day    Minutes of Exercise per Session: 30 min  Stress: No Stress Concern Present (08/15/2024)   [employer] of Occupational Health - Occupational Stress Questionnaire    Feeling of Stress: Not at all  Social Connections: Moderately Integrated (08/15/2024)   Social Connection and Isolation Panel    Frequency of Communication with Friends and Family: More than three times a week    Frequency of Social Gatherings with Friends and Family: Once a week    Attends Religious Services: More than 4 times per year    Active Member of [employer] or Organizations: No    Attends Engineer, Structural: Not on file    Marital Status: Married  Catering Manager Violence: Not on file  Depression (PHQ2-9): Low Risk (08/15/2024)   Depression (PHQ2-9)    PHQ-2 Score: 0  Alcohol Screen: Low Risk (08/15/2024)   Alcohol Screen    Last Alcohol Screening Score (AUDIT): 1  Housing: Low Risk (08/15/2024)   Epic    Unable to Pay for Housing in the Last Year: No    Number of Times Moved in the Last Year: 0    Homeless in the Last Year: No  Utilities: Not on file  Health Literacy: Not on file                                                                                                   Objective:  Physical Exam: BP (!) 151/102   Pulse (!) 113   Temp 98.2 F (36.8 C)   Ht 5' 7 (1.702 m)   Wt 197 lb 6.4  oz (89.5 kg)   SpO2 98%   BMI 30.92 kg/m    Physical Exam Repeat HR:99 GENERAL: Alert, cooperative, well developed, no acute distress. HEENT: Normocephalic, normal oropharynx, moist mucous membranes, left ear normal. CHEST: Clear to auscultation bilaterally, no wheezes, rhonchi, or crackles. CARDIOVASCULAR: Normal heart rate and rhythm, S1 and S2 normal without murmurs. ABDOMEN: Soft, non-tender, non-distended, without organomegaly, normal bowel sounds. EXTREMITIES: No cyanosis or edema. NEUROLOGICAL: Cranial nerves grossly intact, moves all extremities without gross motor or sensory deficit. SKIN: Maculopapular pruritic rash on upper torso, arms, and back.   Physical Exam  MR ABDOMEN WWO CONTRAST Result Date: 07/05/2024 CLINICAL DATA:  Liver lesion, > 1cm. EXAM: MRI ABDOMEN WITHOUT AND WITH CONTRAST TECHNIQUE: Multiplanar multisequence MR imaging of the abdomen was performed both before and after the administration of intravenous contrast. CONTRAST:  9 mL of Vueway  COMPARISON:  Ultrasound abdomen from 06/13/2024. FINDINGS: Lower chest: Unremarkable MR appearance to the lung bases. No pleural effusion. No pericardial effusion. Normal heart size. Hepatobiliary: The liver is moderately enlarged measuring up to 19 cm in length. Noncirrhotic configuration. There are innumerable mildly T1 and T2 hyperintense lesions occupying approximately 50% of the total liver parenchyma ranging in size from few mm up to 5.9 cm. The lesions exhibit marked arterial hyperenhancement without washout on delayed/equilibrium phase images. No abnormal diffusion restriction on high B value images. There is a lesion in the right hepatic lobe at the junction of segments 7/8 (series 5-1, image 14), which exhibits microscopic fat as seen as loss of signal on out of phase  images, in comparison to the in phase images. Several medium sized lesions exhibit central T2 hyperintense scar which exhibit delayed enhancement (for example, series 4, 19 and series 11-18, image 38). Overall, these are favored to represent focal nodular hyperplasia. No other focal liver lesion. No intrahepatic or extrahepatic bile duct dilatation. No choledocholithiasis. Physiologically distended gallbladder containing small volume gallstones without imaging signs of acute cholecystitis. Pancreas: No mass, inflammatory changes or other parenchymal abnormality identified. No main pancreatic duct dilation. Spleen:  Within normal limits in size and appearance. No focal mass. Adrenals/Urinary Tract: Unremarkable adrenal glands. No hydroureteronephrosis. No suspicious renal mass. Stomach/Bowel: Visualized portions within the abdomen are unremarkable. No disproportionate dilation of bowel loops. Unremarkable appendix. Vascular/Lymphatic: No pathologically enlarged lymph nodes identified. No abdominal aortic aneurysm demonstrated. No ascites. Other:  None. Musculoskeletal: No suspicious bone lesions identified. IMPRESSION: 1. There are innumerable lesions throughout the liver, favored to represent focal nodular hyperplasia. Recommend follow-up MRI abdomen with Eovist contrast in 6 months. 2. There is moderate hepatomegaly. 3. Cholelithiasis without acute cholecystitis. Electronically Signed   By: Ree Molt M.D.   On: 07/05/2024 12:35   US  Abdomen Limited RUQ (LIVER/GB) Result Date: 06/13/2024 EXAM: Right Upper Quadrant Abdominal Ultrasound 06/13/2024 08:36:00 AM TECHNIQUE: Real-time ultrasonography of the right upper quadrant of the abdomen was performed. COMPARISON: None available. CLINICAL HISTORY: elevated ALP FINDINGS: LIVER: Heterogeneous hepatic echotexture with multiple intrahepatic masses. The largest lesion is located in the right hepatic lobe and measures 5.7 x 2.7 x 5.7 cm. The next largest lesion is  in the left hepatic lobe and measures 5.0 x 4.8 x 4.7 cm. BILIARY SYSTEM: There are 2 large stones in the gallbladder measuring up to 2.0 cm. Gallbladder wall thickness is 2 mm. No pericholecystic fluid. Common bile duct is within normal limits measuring 3 mm. RIGHT KIDNEY: The right kidney is grossly unremarkable in appearances without evidence of hydronephrosis, echogenic calculi or worrisome  mass lesions. PANCREAS: Visualized portions of the pancreas are unremarkable. OTHER: No right upper quadrant ascites. IMPRESSION: 1. Heterogeneous hepatic echotexture with multiple intrahepatic masses, the largest measuring 5.7 x 2.7 x 5.7 cm in the right hepatic lobe and the next largest measuring 5.0 x 4.8 x 4.7 cm in the left hepatic lobe. MRI of the abdomen with and without contrast is recommended for further evaluation. 2. Two large gallstones measuring up to 2.0 cm. Electronically signed by: Franky Stanford MD 06/13/2024 11:33 AM EST RP Workstation: [id number]    Recent Results (from the past 2160 hours)  POCT URINALYSIS DIP (CLINITEK)     Status: Abnormal   Collection Time: 07/09/24  8:51 AM  Result Value Ref Range   Color, UA yellow yellow   Clarity, UA clear clear   Glucose, UA =500 (A) negative mg/dL   Bilirubin, UA negative negative   Ketones, POC UA trace (5) (A) negative mg/dL   Spec Grav, UA 8.974 8.989 - 1.025   Blood, UA trace-intact (A) negative   pH, UA 7.0 5.0 - 8.0   POC PROTEIN,UA =30 (A) negative, trace   Urobilinogen, UA 1.0 0.2 or 1.0 E.U./dL   Nitrite, UA Negative Negative   Leukocytes, UA Negative Negative  POCT URINALYSIS DIP (CLINITEK)     Status: Abnormal   Collection Time: 07/09/24  2:01 PM  Result Value Ref Range   Color, UA yellow yellow   Clarity, UA clear clear   Glucose, UA =250 (A) negative mg/dL   Bilirubin, UA negative negative   Ketones, POC UA trace (5) (A) negative mg/dL   Spec Grav, UA 8.974 8.989 - 1.025   Blood, UA negative negative   pH, UA 7.0 5.0 - 8.0    POC PROTEIN,UA =100 (A) negative, trace   Urobilinogen, UA 1.0 0.2 or 1.0 E.U./dL   Nitrite, UA Negative Negative   Leukocytes, UA Negative Negative        Beverley Adine Hummer, MD, MS "

## 2024-08-18 ENCOUNTER — Ambulatory Visit: Admitting: Obstetrics and Gynecology

## 2024-08-19 ENCOUNTER — Other Ambulatory Visit: Payer: Self-pay | Admitting: Family Medicine

## 2024-08-19 ENCOUNTER — Other Ambulatory Visit (HOSPITAL_BASED_OUTPATIENT_CLINIC_OR_DEPARTMENT_OTHER): Payer: Self-pay

## 2024-08-19 ENCOUNTER — Telehealth: Payer: Self-pay | Admitting: Family Medicine

## 2024-08-19 DIAGNOSIS — B3731 Acute candidiasis of vulva and vagina: Secondary | ICD-10-CM

## 2024-08-19 DIAGNOSIS — I10 Essential (primary) hypertension: Secondary | ICD-10-CM

## 2024-08-19 DIAGNOSIS — E1169 Type 2 diabetes mellitus with other specified complication: Secondary | ICD-10-CM

## 2024-08-19 DIAGNOSIS — J0110 Acute frontal sinusitis, unspecified: Secondary | ICD-10-CM

## 2024-08-19 MED ORDER — OZEMPIC (0.25 OR 0.5 MG/DOSE) 2 MG/3ML ~~LOC~~ SOPN
0.5000 mg | PEN_INJECTOR | SUBCUTANEOUS | 0 refills | Status: AC
  Filled 2024-08-19: qty 3, 28d supply, fill #0

## 2024-08-19 MED ORDER — AMOXICILLIN-POT CLAVULANATE 875-125 MG PO TABS
1.0000 | ORAL_TABLET | Freq: Two times a day (BID) | ORAL | 0 refills | Status: AC
  Filled 2024-08-19: qty 20, 10d supply, fill #0

## 2024-08-19 MED ORDER — FLUCONAZOLE 150 MG PO TABS
150.0000 mg | ORAL_TABLET | ORAL | 0 refills | Status: AC | PRN
  Filled 2024-08-19: qty 2, 6d supply, fill #0

## 2024-08-19 MED ORDER — PREDNISONE 10 MG PO TABS
ORAL_TABLET | ORAL | 0 refills | Status: AC
  Filled 2024-08-19: qty 21, 6d supply, fill #0

## 2024-08-19 NOTE — Telephone Encounter (Signed)
 Patient called stating she had worsening symptoms.  Patient has worsening foul-smelling purulent drainage on the right side along with intensity headaches above the right orbital.  Despite trial of fluticasone .  Rash has improved with hydroxyzine .  Spoke with patient and recommended trial of prednisone  course pack along with treatment of Augmentin  for possible right frontal sinusitis.  Patient is diabetic and she is out of her Ozempic .  Will refill Ozempic  at 0.5 mg weekly.  Patient to follow-up with PCP for ongoing management for diabetes.  Given patient will be started on a course of steroids and antibiotics, recommend patient watch blood pressure and blood sugar closely.  Will send in course of fluconazole  for post steroid/antibiotic yeast infection.

## 2024-10-03 ENCOUNTER — Ambulatory Visit: Admitting: Nurse Practitioner

## 2024-10-10 ENCOUNTER — Ambulatory Visit: Admitting: Nurse Practitioner
